# Patient Record
Sex: Female | Born: 1990 | Race: White | Hispanic: No | Marital: Married | Smoking: Never smoker
Health system: Southern US, Community
[De-identification: ages and names within clinical notes are randomized; demographics above are authoritative.]

## PROBLEM LIST (undated history)

## (undated) DIAGNOSIS — R87629 Unspecified abnormal cytological findings in specimens from vagina: Secondary | ICD-10-CM

## (undated) DIAGNOSIS — Z8489 Family history of other specified conditions: Secondary | ICD-10-CM

## (undated) DIAGNOSIS — K219 Gastro-esophageal reflux disease without esophagitis: Secondary | ICD-10-CM

## (undated) DIAGNOSIS — R519 Headache, unspecified: Secondary | ICD-10-CM

## (undated) DIAGNOSIS — E039 Hypothyroidism, unspecified: Secondary | ICD-10-CM

## (undated) DIAGNOSIS — F4310 Post-traumatic stress disorder, unspecified: Secondary | ICD-10-CM

## (undated) DIAGNOSIS — E282 Polycystic ovarian syndrome: Secondary | ICD-10-CM

## (undated) DIAGNOSIS — N390 Urinary tract infection, site not specified: Secondary | ICD-10-CM

## (undated) HISTORY — PX: TONSILLECTOMY: SUR1361

## (undated) HISTORY — PX: EYE SURGERY: SHX253

## (undated) HISTORY — PX: APPENDECTOMY: SHX54

## (undated) HISTORY — DX: Polycystic ovarian syndrome: E28.2

---

## 2019-06-17 LAB — OB RESULTS CONSOLE ABO/RH: RH Type: POSITIVE

## 2019-06-17 LAB — OB RESULTS CONSOLE RUBELLA ANTIBODY, IGM: Rubella: IMMUNE

## 2019-06-17 LAB — OB RESULTS CONSOLE RPR: RPR: NONREACTIVE

## 2019-06-17 LAB — OB RESULTS CONSOLE ANTIBODY SCREEN: Antibody Screen: NEGATIVE

## 2019-06-17 LAB — OB RESULTS CONSOLE GC/CHLAMYDIA
Chlamydia: NEGATIVE
Gonorrhea: NEGATIVE

## 2019-06-17 LAB — OB RESULTS CONSOLE HEPATITIS B SURFACE ANTIGEN: Hepatitis B Surface Ag: NEGATIVE

## 2019-06-17 LAB — OB RESULTS CONSOLE HIV ANTIBODY (ROUTINE TESTING): HIV: NONREACTIVE

## 2019-10-01 ENCOUNTER — Inpatient Hospital Stay (HOSPITAL_COMMUNITY)
Admission: AD | Admit: 2019-10-01 | Discharge: 2019-10-01 | Disposition: A | Discharge status: Home / Self Care | Payer: No Typology Code available for payment source | Attending: Obstetrics and Gynecology | Admitting: Obstetrics and Gynecology

## 2019-10-01 ENCOUNTER — Encounter (HOSPITAL_COMMUNITY): Payer: Self-pay | Admitting: *Deleted

## 2019-10-01 ENCOUNTER — Other Ambulatory Visit: Payer: Self-pay

## 2019-10-01 ENCOUNTER — Inpatient Hospital Stay (HOSPITAL_BASED_OUTPATIENT_CLINIC_OR_DEPARTMENT_OTHER): Payer: No Typology Code available for payment source

## 2019-10-01 DIAGNOSIS — K219 Gastro-esophageal reflux disease without esophagitis: Secondary | ICD-10-CM | POA: Diagnosis not present

## 2019-10-01 DIAGNOSIS — R1030 Lower abdominal pain, unspecified: Secondary | ICD-10-CM | POA: Diagnosis present

## 2019-10-01 DIAGNOSIS — O9A212 Injury, poisoning and certain other consequences of external causes complicating pregnancy, second trimester: Secondary | ICD-10-CM

## 2019-10-01 DIAGNOSIS — Z3A25 25 weeks gestation of pregnancy: Secondary | ICD-10-CM | POA: Diagnosis not present

## 2019-10-01 DIAGNOSIS — Z79899 Other long term (current) drug therapy: Secondary | ICD-10-CM | POA: Insufficient documentation

## 2019-10-01 DIAGNOSIS — W500XXA Accidental hit or strike by another person, initial encounter: Secondary | ICD-10-CM

## 2019-10-01 DIAGNOSIS — E039 Hypothyroidism, unspecified: Secondary | ICD-10-CM | POA: Insufficient documentation

## 2019-10-01 HISTORY — DX: Hypothyroidism, unspecified: E03.9

## 2019-10-01 HISTORY — DX: Gastro-esophageal reflux disease without esophagitis: K21.9

## 2019-10-01 LAB — URINALYSIS, ROUTINE W REFLEX MICROSCOPIC
Bilirubin Urine: NEGATIVE
Glucose, UA: NEGATIVE mg/dL
Hgb urine dipstick: NEGATIVE
Ketones, ur: NEGATIVE mg/dL
Leukocytes,Ua: NEGATIVE
Nitrite: NEGATIVE
Protein, ur: NEGATIVE mg/dL
Specific Gravity, Urine: 1.005 (ref 1.005–1.030)
pH: 6 (ref 5.0–8.0)
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
## 2019-10-01 NOTE — Discharge Instructions (Signed)
Preventing Injuries During Pregnancy ° °Injuries can happen during pregnancy. Minor falls and accidents usually do not harm you or your baby. But some injuries can harm you and your baby. Tell your doctor about any injury you suffer. °What can I do to avoid injuries? °Safety °· Remove rugs and loose objects on the floor. °· Wear comfortable shoes that have a good grip. Do not wear shoes that have high heels. °· Always wear your seat belt in the car. The lap belt should be below your belly. Always drive safely. °· Do not ride on a motorcycle. °Activity °· Do not take part in rough and violent activities or sports. °· Avoid: °? Walking on wet or slippery floors. °? Lifting heavy pots of boiling or hot liquids. °? Fixing electrical problems. °? Being near fires. °General instructions °· Take over-the-counter and prescription medicines only as told by your doctor. °· Know your blood type and the blood type of the baby's father. °· If you are a victim of domestic violence: °? Call your local emergency services (911 in the [place] °? Contact the National Domestic Violence Hotline for help and support. °Get help right away if: °· You fall on your belly or receive any serious blow to your belly. °· You have a stiff neck or neck pain after a fall or an injury. °· You get a headache or have problems with vision after an injury. °· You do not feel the baby move or the baby is not moving as much as normal. °· You have been a victim of domestic violence or any other kind of attack. °· You have been in a car accident. °· You have bleeding from your vagina. °· Fluid is leaking from your vagina. °· You start to have cramping or pain in your belly (contractions). °· You have very bad pain in your lower back. °· You feel weak or pass out (faint). °· You start to throw up (vomit) after an injury. °· You have been burned. °Summary °· Some injuries that happen during pregnancy can do harm to the baby. °· Tell your doctor about any  injury. °· Take steps to avoid injury. This includes removing rugs and loose objects on the floor. Always wear your seat belt in the car. °· Do not take part in rough and violent activities or sports. °· Get help right away if you have any serious accident or injury. °This information is not intended to replace advice given to you by your health care provider. Make sure you discuss any questions you have with your health care provider. °Document Released: 12/15/2010 Document Revised: 11/21/2016 Document Reviewed: 11/21/2016 °Elsevier Patient Education © 2020 Elsevier Inc. ° °

## 2019-11-26 ENCOUNTER — Inpatient Hospital Stay (HOSPITAL_COMMUNITY)
Admission: AD | Admit: 2019-11-26 | Discharge: 2019-11-26 | Disposition: A | Discharge status: Home / Self Care | Payer: No Typology Code available for payment source | Attending: Obstetrics and Gynecology | Admitting: Obstetrics and Gynecology

## 2019-11-26 ENCOUNTER — Encounter (HOSPITAL_COMMUNITY): Payer: Self-pay | Admitting: Obstetrics and Gynecology

## 2019-11-26 DIAGNOSIS — O4703 False labor before 37 completed weeks of gestation, third trimester: Secondary | ICD-10-CM | POA: Insufficient documentation

## 2019-11-26 DIAGNOSIS — R102 Pelvic and perineal pain: Secondary | ICD-10-CM | POA: Insufficient documentation

## 2019-11-26 DIAGNOSIS — O99283 Endocrine, nutritional and metabolic diseases complicating pregnancy, third trimester: Secondary | ICD-10-CM | POA: Insufficient documentation

## 2019-11-26 DIAGNOSIS — O99891 Other specified diseases and conditions complicating pregnancy: Secondary | ICD-10-CM | POA: Insufficient documentation

## 2019-11-26 DIAGNOSIS — Z3A33 33 weeks gestation of pregnancy: Secondary | ICD-10-CM | POA: Diagnosis not present

## 2019-11-26 DIAGNOSIS — O479 False labor, unspecified: Secondary | ICD-10-CM

## 2019-11-26 DIAGNOSIS — O34219 Maternal care for unspecified type scar from previous cesarean delivery: Secondary | ICD-10-CM | POA: Diagnosis not present

## 2019-11-26 DIAGNOSIS — E860 Dehydration: Secondary | ICD-10-CM | POA: Insufficient documentation

## 2019-11-26 HISTORY — DX: Post-traumatic stress disorder, unspecified: F43.10

## 2019-11-26 LAB — URINALYSIS, ROUTINE W REFLEX MICROSCOPIC
Bilirubin Urine: NEGATIVE
Glucose, UA: 150 mg/dL — AB
Hgb urine dipstick: NEGATIVE
Ketones, ur: 20 mg/dL — AB
Leukocytes,Ua: NEGATIVE
Nitrite: NEGATIVE
Protein, ur: NEGATIVE mg/dL
Specific Gravity, Urine: 1.017 (ref 1.005–1.030)
pH: 6 (ref 5.0–8.0)

## 2019-11-26 MED ORDER — NIFEDIPINE 10 MG PO CAPS
10.0000 mg | ORAL_CAPSULE | ORAL | Status: AC
  Administered 2019-11-26 (×3): 10 mg via ORAL
  Filled 2019-11-26 (×3): qty 1

## 2019-11-26 NOTE — MAU Note (Signed)
Been having contractions all day, they were about 41min apart, no are 75min.  Feeling a lot of pelvic pressure and hip pain,going on for 3 days. Denies bleeding or leaking.

## 2019-11-26 NOTE — Discharge Instructions (Signed)
Braxton Hicks Contractions °Contractions of the uterus can occur throughout pregnancy, but they are not always a sign that you are in labor. You may have practice contractions called Braxton Hicks contractions. These false labor contractions are sometimes confused with true labor. °What are Braxton Hicks contractions? °Braxton Hicks contractions are tightening movements that occur in the muscles of the uterus before labor. Unlike true labor contractions, these contractions do not result in opening (dilation) and thinning of the cervix. Toward the end of pregnancy (32-34 weeks), Braxton Hicks contractions can happen more often and may become stronger. These contractions are sometimes difficult to tell apart from true labor because they can be very uncomfortable. You should not feel embarrassed if you go to the hospital with false labor. °Sometimes, the only way to tell if you are in true labor is for your health care provider to look for changes in the cervix. The health care provider will do a physical exam and may monitor your contractions. If you are not in true labor, the exam should show that your cervix is not dilating and your water has not broken. °If there are no other health problems associated with your pregnancy, it is completely safe for you to be sent home with false labor. You may continue to have Braxton Hicks contractions until you go into true labor. °How to tell the difference between true labor and false labor °True labor °· Contractions last 30-70 seconds. °· Contractions become very regular. °· Discomfort is usually felt in the top of the uterus, and it spreads to the lower abdomen and low back. °· Contractions do not go away with walking. °· Contractions usually become more intense and increase in frequency. °· The cervix dilates and gets thinner. °False labor °· Contractions are usually shorter and not as strong as true labor contractions. °· Contractions are usually irregular. °· Contractions  are often felt in the front of the lower abdomen and in the groin. °· Contractions may go away when you walk around or change positions while lying down. °· Contractions get weaker and are shorter-lasting as time goes on. °· The cervix usually does not dilate or become thin. °Follow these instructions at home: ° °· Take over-the-counter and prescription medicines only as told by your health care provider. °· Keep up with your usual exercises and follow other instructions from your health care provider. °· Eat and drink lightly if you think you are going into labor. °· If Braxton Hicks contractions are making you uncomfortable: °? Change your position from lying down or resting to walking, or change from walking to resting. °? Sit and rest in a tub of warm water. °? Drink enough fluid to keep your urine pale yellow. Dehydration may cause these contractions. °? Do slow and deep breathing several times an hour. °· Keep all follow-up prenatal visits as told by your health care provider. This is important. °Contact a health care provider if: °· You have a fever. °· You have continuous pain in your abdomen. °Get help right away if: °· Your contractions become stronger, more regular, and closer together. °· You have fluid leaking or gushing from your vagina. °· You pass blood-tinged mucus (bloody show). °· You have bleeding from your vagina. °· You have low back pain that you never had before. °· You feel your baby’s head pushing down and causing pelvic pressure. °· Your baby is not moving inside you as much as it used to. °Summary °· Contractions that occur before labor are   called Braxton Hicks contractions, false labor, or practice contractions. °· Braxton Hicks contractions are usually shorter, weaker, farther apart, and less regular than true labor contractions. True labor contractions usually become progressively stronger and regular, and they become more frequent. °· Manage discomfort from Braxton Hicks contractions  by changing position, resting in a warm bath, drinking plenty of water, or practicing deep breathing. °This information is not intended to replace advice given to you by your health care provider. Make sure you discuss any questions you have with your health care provider. °Document Revised: 10/25/2017 Document Reviewed: 03/28/2017 °Elsevier Patient Education © 2020 Elsevier Inc. ° °

## 2019-11-26 NOTE — MAU Provider Note (Signed)
History     CSN: [id number]  Arrival date and time: 11/26/19 1747   First Provider Initiated Contact with Patient 11/26/19 1927      Chief Complaint  Patient presents with  . Contractions  . Pelvic Pain   HPI   [name] is a 28 y.o. female G2P1001 @ [redacted]w[redacted]d here in MAU with contractions. States she started having contractions around 0400. Initially they were 20-25 minutes apart, now they are more frequent however less painful. She is concerned because they are not going away. States she has drank around a 1/2 case of water today and does not feel the contractions are d/t dehydration. + fetal movement. No bleeding. No history of preterm labor.   OB History    Gravida  2   Para  1   Term  1   Preterm      AB      Living  1     SAB      TAB      Ectopic      Multiple      Live Births  1           Past Medical History:  Diagnosis Date  . GERD (gastroesophageal reflux disease)   . Hypothyroidism   . Post traumatic stress disorder (PTSD)     Past Surgical History:  Procedure Laterality Date  . APPENDECTOMY    . CESAREAN SECTION    . TONSILLECTOMY      No family history on file.  Social History   Tobacco Use  . Smoking status: Never Smoker  . Smokeless tobacco: Never Used  Substance Use Topics  . Alcohol use: Never  . Drug use: Never    Allergies: No Known Allergies  Medications Prior to Admission  Medication Sig Dispense Refill Last Dose  . cholecalciferol (VITAMIN D3) 25 MCG (1000 UT) tablet Take 1,000 Units by mouth daily.   11/26/2019 at Unknown time  . levothyroxine (SYNTHROID) 50 MCG tablet Take 50 mcg by mouth daily before breakfast.   11/26/2019 at Unknown time  . pantoprazole (PROTONIX) 40 MG tablet Take 40 mg by mouth daily.   11/26/2019 at Unknown time  . Prenatal Vit-Fe Fumarate-FA (PRENATAL MULTIVITAMIN) TABS tablet Take 1 tablet by mouth daily at 12 noon.   11/26/2019 at Unknown time  . sertraline (ZOLOFT) 50 MG  tablet Take 50 mg by mouth daily.   11/26/2019 at Unknown time  . vitamin B-12 (CYANOCOBALAMIN) 500 MCG tablet Take 500 mcg by mouth daily.   More than a month at Unknown time   Results for orders placed or performed during the hospital encounter of 11/26/19 (from the past 48 hour(s))  Urinalysis, Routine w reflex microscopic     Status: Abnormal   Collection Time: 11/26/19  7:06 PM  Result Value Ref Range   Color, Urine YELLOW YELLOW   APPearance CLEAR CLEAR   Specific Gravity, Urine 1.017 1.005 - 1.030   pH 6.0 5.0 - 8.0   Glucose, UA 150 (A) NEGATIVE mg/dL   Hgb urine dipstick NEGATIVE NEGATIVE   Bilirubin Urine NEGATIVE NEGATIVE   Ketones, ur 20 (A) NEGATIVE mg/dL   Protein, ur NEGATIVE NEGATIVE mg/dL   Nitrite NEGATIVE NEGATIVE   Leukocytes,Ua NEGATIVE NEGATIVE    Comment: Performed at [facility] [address], [place], [place] [address]   Review of Systems  Genitourinary: Positive for pelvic pain. Negative for decreased urine volume, vaginal bleeding, vaginal discharge and vaginal pain.  Musculoskeletal: Negative for back pain.   Physical Exam   Blood pressure 119/73, pulse (!) 102, temperature 98.2 F (36.8 C), temperature source Oral, resp. rate 18, weight 102.5 kg, last menstrual period 04/05/2019, SpO2 99 %.  Physical Exam  Constitutional: She is oriented to person, place, and time. She appears well-developed and well-nourished. No distress.  Respiratory: Effort normal.  GI: Soft. She exhibits no distension and no mass. There is no abdominal tenderness. There is no rebound and no guarding.  Genitourinary: There is no rash, tenderness or lesion on the right labia. There is no rash, tenderness or lesion on the left labia.    No vaginal bleeding.  No bleeding in the vagina.    Genitourinary Comments: Dilation: Closed Effacement (%): Thick Cervical Position: Posterior FFN collected Exam by:: Venia Carbon NP   Musculoskeletal:        General: Normal range of  motion.  Neurological: She is alert and oriented to person, place, and time.  Skin: Skin is warm. She is not diaphoretic.  Psychiatric: She has a normal mood and affect. Her behavior is normal.   Fetal Tracing: Baseline: 140 bpm Variability: Moderate  Accelerations: 15x15 Decelerations: None Toco: Occasional   MAU Course  Procedures None  MDM  FFN not sent d/t closed cervix.  UA with ketones, encouraged PO hydration while in MAU>  2 doses of procardia given.  Patient feeling much better. Ok to go home. Still having occasional contractions, will given 1 additional dose of procardia prior to DC home. Abdomen is soft, non- tender.   Assessment and Plan   A:  1. Braxton Hicks contractions   2. [redacted] weeks gestation of pregnancy   3. Mild dehydration     P:  Discharge home in stable condition Return to MAU if symptoms worsen F/u next week Dr. Mindi Slicker  Preterm labor precautions Increase oral fluid intake  Kache Mcclurg, Harolyn Rutherford, NP 11/26/2019 9:11 PM

## 2019-12-17 LAB — OB RESULTS CONSOLE GBS: GBS: NEGATIVE

## 2019-12-30 ENCOUNTER — Encounter (HOSPITAL_COMMUNITY): Payer: Self-pay | Admitting: *Deleted

## 2019-12-30 ENCOUNTER — Telehealth (HOSPITAL_COMMUNITY): Payer: Self-pay | Admitting: *Deleted

## 2019-12-30 NOTE — Telephone Encounter (Signed)
Preadmission screen  

## 2020-01-06 ENCOUNTER — Other Ambulatory Visit (HOSPITAL_COMMUNITY): Payer: No Typology Code available for payment source

## 2020-01-06 ENCOUNTER — Other Ambulatory Visit (HOSPITAL_COMMUNITY)
Admission: RE | Admit: 2020-01-06 | Discharge: 2020-01-06 | Disposition: A | Discharge status: Home / Self Care | Payer: No Typology Code available for payment source | Source: Ambulatory Visit | Attending: Obstetrics and Gynecology | Admitting: Obstetrics and Gynecology

## 2020-01-06 DIAGNOSIS — Z20822 Contact with and (suspected) exposure to covid-19: Secondary | ICD-10-CM | POA: Insufficient documentation

## 2020-01-06 DIAGNOSIS — Z01812 Encounter for preprocedural laboratory examination: Secondary | ICD-10-CM | POA: Insufficient documentation

## 2020-01-06 LAB — SARS CORONAVIRUS 2 (TAT 6-24 HRS): SARS Coronavirus 2: NEGATIVE

## 2020-01-07 ENCOUNTER — Other Ambulatory Visit: Payer: Self-pay | Admitting: Obstetrics and Gynecology

## 2020-01-08 ENCOUNTER — Encounter (HOSPITAL_COMMUNITY): Payer: Self-pay | Admitting: Obstetrics and Gynecology

## 2020-01-08 ENCOUNTER — Inpatient Hospital Stay (HOSPITAL_COMMUNITY): Payer: No Typology Code available for payment source | Admitting: Anesthesiology

## 2020-01-08 ENCOUNTER — Other Ambulatory Visit: Payer: Self-pay

## 2020-01-08 ENCOUNTER — Inpatient Hospital Stay (HOSPITAL_COMMUNITY): Payer: No Typology Code available for payment source

## 2020-01-08 ENCOUNTER — Inpatient Hospital Stay (HOSPITAL_COMMUNITY)
Admission: AD | Admit: 2020-01-08 | Discharge: 2020-01-11 | DRG: 788 | Disposition: A | Discharge status: Home / Self Care | Payer: No Typology Code available for payment source | Attending: Obstetrics and Gynecology | Admitting: Obstetrics and Gynecology

## 2020-01-08 DIAGNOSIS — K219 Gastro-esophageal reflux disease without esophagitis: Secondary | ICD-10-CM | POA: Diagnosis present

## 2020-01-08 DIAGNOSIS — E669 Obesity, unspecified: Secondary | ICD-10-CM | POA: Diagnosis present

## 2020-01-08 DIAGNOSIS — Z98891 History of uterine scar from previous surgery: Secondary | ICD-10-CM

## 2020-01-08 DIAGNOSIS — O9962 Diseases of the digestive system complicating childbirth: Secondary | ICD-10-CM | POA: Diagnosis present

## 2020-01-08 DIAGNOSIS — E039 Hypothyroidism, unspecified: Secondary | ICD-10-CM | POA: Diagnosis present

## 2020-01-08 DIAGNOSIS — O34211 Maternal care for low transverse scar from previous cesarean delivery: Secondary | ICD-10-CM | POA: Diagnosis present

## 2020-01-08 DIAGNOSIS — O99284 Endocrine, nutritional and metabolic diseases complicating childbirth: Secondary | ICD-10-CM | POA: Diagnosis present

## 2020-01-08 DIAGNOSIS — O99214 Obesity complicating childbirth: Secondary | ICD-10-CM | POA: Diagnosis present

## 2020-01-08 DIAGNOSIS — F4310 Post-traumatic stress disorder, unspecified: Secondary | ICD-10-CM | POA: Diagnosis present

## 2020-01-08 DIAGNOSIS — Z362 Encounter for other antenatal screening follow-up: Secondary | ICD-10-CM | POA: Diagnosis not present

## 2020-01-08 DIAGNOSIS — Z3A39 39 weeks gestation of pregnancy: Secondary | ICD-10-CM | POA: Diagnosis not present

## 2020-01-08 DIAGNOSIS — Z20822 Contact with and (suspected) exposure to covid-19: Secondary | ICD-10-CM | POA: Diagnosis present

## 2020-01-08 DIAGNOSIS — O368390 Maternal care for abnormalities of the fetal heart rate or rhythm, unspecified trimester, not applicable or unspecified: Secondary | ICD-10-CM | POA: Diagnosis not present

## 2020-01-08 DIAGNOSIS — O99344 Other mental disorders complicating childbirth: Secondary | ICD-10-CM | POA: Diagnosis present

## 2020-01-08 LAB — CBC
HCT: 37.3 % (ref 36.0–46.0)
Hemoglobin: 11.9 g/dL — ABNORMAL LOW (ref 12.0–15.0)
MCH: 28.1 pg (ref 26.0–34.0)
MCHC: 31.9 g/dL (ref 30.0–36.0)
MCV: 88.2 fL (ref 80.0–100.0)
Platelets: 156 10*3/uL (ref 150–400)
RBC: 4.23 MIL/uL (ref 3.87–5.11)
RDW: 13.2 % (ref 11.5–15.5)
WBC: 12.6 10*3/uL — ABNORMAL HIGH (ref 4.0–10.5)
nRBC: 0 % (ref 0.0–0.2)

## 2020-01-08 LAB — TYPE AND SCREEN
ABO/RH(D): O POS
Antibody Screen: NEGATIVE

## 2020-01-08 LAB — RPR: RPR Ser Ql: NONREACTIVE

## 2020-01-08 LAB — ABO/RH: ABO/RH(D): O POS

## 2020-01-08 MED ORDER — PHENYLEPHRINE 40 MCG/ML (10ML) SYRINGE FOR IV PUSH (FOR BLOOD PRESSURE SUPPORT): 80.0000 ug | PREFILLED_SYRINGE | INTRAVENOUS | Status: DC | PRN

## 2020-01-08 MED ORDER — FENTANYL-BUPIVACAINE-NACL 0.5-0.125-0.9 MG/250ML-% EP SOLN
12.0000 mL/h | EPIDURAL | Status: DC | PRN
  Filled 2020-01-08: qty 250

## 2020-01-08 MED ORDER — OXYCODONE-ACETAMINOPHEN 5-325 MG PO TABS: 2.0000 | ORAL_TABLET | ORAL | Status: DC | PRN

## 2020-01-08 MED ORDER — OXYCODONE-ACETAMINOPHEN 5-325 MG PO TABS: 1.0000 | ORAL_TABLET | ORAL | Status: DC | PRN

## 2020-01-08 MED ORDER — HYDROCORTISONE 1 % EX CREA
TOPICAL_CREAM | Freq: Three times a day (TID) | CUTANEOUS | Status: DC
  Filled 2020-01-08: qty 28

## 2020-01-08 MED ORDER — ONDANSETRON HCL 4 MG/2ML IJ SOLN
4.0000 mg | Freq: Four times a day (QID) | INTRAMUSCULAR | Status: DC | PRN
  Administered 2020-01-08: 4 mg via INTRAVENOUS
  Filled 2020-01-08: qty 2

## 2020-01-08 MED ORDER — DIPHENHYDRAMINE HCL 50 MG/ML IJ SOLN: 12.5000 mg | INTRAMUSCULAR | Status: DC | PRN

## 2020-01-08 MED ORDER — TERBUTALINE SULFATE 1 MG/ML IJ SOLN: 0.2500 mg | Freq: Once | INTRAMUSCULAR | Status: DC | PRN

## 2020-01-08 MED ORDER — BUTORPHANOL TARTRATE 1 MG/ML IJ SOLN
1.0000 mg | INTRAMUSCULAR | Status: DC | PRN
  Administered 2020-01-08 (×2): 1 mg via INTRAVENOUS
  Filled 2020-01-08 (×2): qty 1

## 2020-01-08 MED ORDER — LIDOCAINE HCL (PF) 1 % IJ SOLN: 30.0000 mL | INTRAMUSCULAR | Status: DC | PRN

## 2020-01-08 MED ORDER — OXYTOCIN 40 UNITS IN NORMAL SALINE INFUSION - SIMPLE MED: 2.5000 [IU]/h | INTRAVENOUS | Status: DC

## 2020-01-08 MED ORDER — ACETAMINOPHEN 325 MG PO TABS: 650.0000 mg | ORAL_TABLET | ORAL | Status: DC | PRN

## 2020-01-08 MED ORDER — LIDOCAINE HCL (PF) 1 % IJ SOLN: INTRAMUSCULAR | Status: DC | PRN

## 2020-01-08 MED ORDER — EPHEDRINE 5 MG/ML INJ: 10.0000 mg | INTRAVENOUS | Status: DC | PRN

## 2020-01-08 MED ORDER — LACTATED RINGERS IV SOLN: INTRAVENOUS | Status: DC

## 2020-01-08 MED ORDER — LACTATED RINGERS IV SOLN
500.0000 mL | INTRAVENOUS | Status: DC | PRN
  Administered 2020-01-09: 500 mL via INTRAVENOUS

## 2020-01-08 MED ORDER — OXYTOCIN BOLUS FROM INFUSION: 500.0000 mL | Freq: Once | INTRAVENOUS | Status: DC

## 2020-01-08 MED ORDER — SOD CITRATE-CITRIC ACID 500-334 MG/5ML PO SOLN
30.0000 mL | ORAL | Status: DC | PRN
  Administered 2020-01-09: 30 mL via ORAL
  Filled 2020-01-08: qty 30

## 2020-01-08 MED ORDER — OXYTOCIN 40 UNITS IN NORMAL SALINE INFUSION - SIMPLE MED
1.0000 m[IU]/min | INTRAVENOUS | Status: DC
  Administered 2020-01-08: 2 m[IU]/min via INTRAVENOUS
  Filled 2020-01-08: qty 1000

## 2020-01-08 MED ORDER — FENTANYL 2.5 MCG/ML BUPIVACAINE 1/10 % EPIDURAL INFUSION (WH - ANES): INTRAMUSCULAR | Status: DC | PRN

## 2020-01-08 MED ORDER — LACTATED RINGERS IV SOLN
500.0000 mL | Freq: Once | INTRAVENOUS | Status: AC
  Administered 2020-01-08: 500 mL via INTRAVENOUS

## 2020-01-08 NOTE — Progress Notes (Signed)
Feeling ctx Afeb, VSS FHT- 140-150, Cat I, ctx q 3-4 min VE-3-4/60/-2, vtx, AROM clear Continue pitocin, monitor progress

## 2020-01-08 NOTE — Anesthesia Procedure Notes (Deleted)

## 2020-01-08 NOTE — Progress Notes (Signed)
After VE and IUPC, FHT with some ? early decels, then a 6 minute decel which responded to position change.  FHT currently 140s, mod variability with pitocin off.  Will let FHT recover for about 15 minutes, then restart pitocin and see how baby tolerates

## 2020-01-08 NOTE — H&P (Signed)
Kim Park is a 29 y.o. femaleG2 P1001, EGA 39+ weeks with EDC 2-15 presenting for attempted VBAC induction.  Prenatal care essentially uncomplicated, on Zoloft for PTSD, previous LTCS, desires VBAC, has signed consent.  Had intracervical foley placed around 1100 yesterday for cervical ripening, reactive NST after, catheter is still in place this morning.  OB History    Gravida  2   Para  1   Term  1   Preterm      AB      Living  1     SAB      TAB      Ectopic      Multiple      Live Births  1          Past Medical History:  Diagnosis Date  . GERD (gastroesophageal reflux disease)   . Hypothyroidism   . PCOS (polycystic ovarian syndrome)   . Post traumatic stress disorder (PTSD)    Past Surgical History:  Procedure Laterality Date  . APPENDECTOMY    . CESAREAN SECTION    . EYE SURGERY    . TONSILLECTOMY     Family History: family history includes Breast cancer in her mother; Diabetes in her father; Hypertension in her father; Ovarian cancer in her mother; Stroke in her father; Thyroid cancer in her mother. Social History:  reports that she has never smoked. She has never used smokeless tobacco. She reports that she does not drink alcohol or use drugs.     Maternal Diabetes: No Genetic Screening: Normal Maternal Ultrasounds/Referrals: Normal Fetal Ultrasounds or other Referrals:  None Maternal Substance Abuse:  No Significant Maternal Medications:  None Significant Maternal Lab Results:  Group B Strep negative Other Comments:  None  Review of Systems  Respiratory: Negative.   Cardiovascular: Negative.    Maternal Medical History:  Fetal activity: Perceived fetal activity is normal.    Prenatal complications: no prenatal complications Prenatal Complications - Diabetes: none.      Blood pressure (!) 130/97, pulse (!) 108, last menstrual period 04/05/2019. Maternal Exam:  Uterine Assessment: Contraction strength is mild.  Contraction  frequency is rare.   Abdomen: Patient reports no abdominal tenderness. Surgical scars: low transverse.   Estimated fetal weight is 8 lbs.   Fetal presentation: vertex  Introitus: Normal vulva. Normal vagina.  Amniotic fluid character: not assessed.  Pelvis: adequate for delivery.      Physical Exam  Constitutional: She appears well-developed and well-nourished.  Cardiovascular: Normal rate and regular rhythm.  Respiratory: Effort normal. No respiratory distress.  GI: Soft.  Genitourinary:    Vulva normal.     Prenatal labs: ABO, Rh: O/Positive/-- (07/22 0000) Antibody: Negative (07/22 0000) Rubella: Immune (07/22 0000) RPR: Nonreactive (07/22 0000)  HBsAg: Negative (07/22 0000)  HIV: Non-reactive (07/22 0000)  GBS: Negative/-- (01/21 0000)   Assessment/Plan: IUP at 39+ weeks, previous LTCS for attempted VBAC.  Has intracervical foley in place, will start pitocin, monitor progress   Zenaida Niece 01/08/2020, 9:02 AM

## 2020-01-08 NOTE — Anesthesia Preprocedure Evaluation (Deleted)
Anesthesia Evaluation  Patient identified by MRN, date of birth, ID band Patient awake    Reviewed: Allergy & Precautions, NPO status , Patient's Chart, lab work & pertinent test results  Airway Mallampati: II  TM Distance: >3 FB Neck ROM: Full    Dental no notable dental hx. (+) Teeth Intact   Pulmonary neg pulmonary ROS,    Pulmonary exam normal breath sounds clear to auscultation       Cardiovascular Exercise Tolerance: Good negative cardio ROS Normal cardiovascular exam Rhythm:Regular Rate:Normal     Neuro/Psych Hx of PTSDnegative neurological ROS     GI/Hepatic Neg liver ROS,   Endo/Other  Hypothyroidism   Renal/GU negative Renal ROS     Musculoskeletal   Abdominal (+) + obese,   Peds  Hematology Hgb 11.0  Plt 156   Anesthesia Other Findings   Reproductive/Obstetrics (+) Pregnancy                             Anesthesia Physical Anesthesia Plan  ASA: III  Anesthesia Plan: Epidural   Post-op Pain Management:    Induction:   PONV Risk Score and Plan:   Airway Management Planned:   Additional Equipment:   Intra-op Plan:   Post-operative Plan:   Informed Consent: I have reviewed the patients History and Physical, chart, labs and discussed the procedure including the risks, benefits and alternatives for the proposed anesthesia with the patient or authorized representative who has indicated his/her understanding and acceptance.       Plan Discussed with:   Anesthesia Plan Comments: (39 5/7 Wk G2 P1 For TOLAC  w Hx of PCOS and hypo Thyroid)        Anesthesia Quick Evaluation

## 2020-01-08 NOTE — Progress Notes (Signed)
Comfortable with epidural Afeb, VSS FHT- 130s, mod variability, + accels, some variable and early decels, Cat II, ctx q 2-3 min VE-5/80/-1, vtx, IUPC placed Will continue pitocin, monitor progress, anticipate VBAC

## 2020-01-08 NOTE — Progress Notes (Signed)
Feeling some ctx, foley bulb came out at 1100 Afeb, VSS FHT-130-140, Cat I VE3/30/-3, vtx Will continue pitocin, monitor progress, AROM at lower station

## 2020-01-08 NOTE — Progress Notes (Signed)
Per request by Dr. Jackelyn Knife and Maralyn Sago RN, at bedside to confirm presentation. Vertex confirmed via ultrasound.  Clayton Bibles, MSN, CNM Certified Nurse Midwife, [employer] for [employer], [facility] 01/08/20 10:05 AM

## 2020-01-09 ENCOUNTER — Encounter (HOSPITAL_COMMUNITY)
Admission: AD | Disposition: A | Discharge status: Home / Self Care | Payer: Self-pay | Source: Home / Self Care | Attending: Obstetrics and Gynecology

## 2020-01-09 ENCOUNTER — Encounter (HOSPITAL_COMMUNITY): Payer: Self-pay | Admitting: Obstetrics and Gynecology

## 2020-01-09 DIAGNOSIS — O368390 Maternal care for abnormalities of the fetal heart rate or rhythm, unspecified trimester, not applicable or unspecified: Secondary | ICD-10-CM | POA: Diagnosis not present

## 2020-01-09 SURGERY — Surgical Case
Anesthesia: Regional

## 2020-01-09 MED ORDER — NALBUPHINE HCL 10 MG/ML IJ SOLN: 5.0000 mg | INTRAMUSCULAR | Status: DC | PRN

## 2020-01-09 MED ORDER — SENNOSIDES-DOCUSATE SODIUM 8.6-50 MG PO TABS
2.0000 | ORAL_TABLET | ORAL | Status: DC
  Administered 2020-01-09 – 2020-01-10 (×2): 2 via ORAL
  Filled 2020-01-09 (×2): qty 2

## 2020-01-09 MED ORDER — KETOROLAC TROMETHAMINE 30 MG/ML IJ SOLN
INTRAMUSCULAR | Status: AC
  Filled 2020-01-09: qty 1

## 2020-01-09 MED ORDER — MEASLES, MUMPS & RUBELLA VAC IJ SOLR: 0.5000 mL | Freq: Once | INTRAMUSCULAR | Status: DC

## 2020-01-09 MED ORDER — SERTRALINE HCL 50 MG PO TABS
50.0000 mg | ORAL_TABLET | Freq: Two times a day (BID) | ORAL | Status: DC
  Administered 2020-01-09 – 2020-01-11 (×5): 50 mg via ORAL
  Filled 2020-01-09 (×5): qty 1

## 2020-01-09 MED ORDER — LEVOTHYROXINE SODIUM 50 MCG PO TABS
50.0000 ug | ORAL_TABLET | Freq: Every day | ORAL | Status: DC
  Administered 2020-01-09 – 2020-01-11 (×3): 50 ug via ORAL
  Filled 2020-01-09 (×3): qty 1

## 2020-01-09 MED ORDER — LIDOCAINE-EPINEPHRINE (PF) 2 %-1:200000 IJ SOLN
INTRAMUSCULAR | Status: DC | PRN
  Administered 2020-01-09: 1 mL via EPIDURAL
  Administered 2020-01-09: 5 mL via EPIDURAL

## 2020-01-09 MED ORDER — ONDANSETRON HCL 4 MG/2ML IJ SOLN
INTRAMUSCULAR | Status: DC | PRN
  Administered 2020-01-09: 4 mg via INTRAVENOUS

## 2020-01-09 MED ORDER — FENTANYL CITRATE (PF) 100 MCG/2ML IJ SOLN
INTRAMUSCULAR | Status: AC
  Filled 2020-01-09: qty 2

## 2020-01-09 MED ORDER — SODIUM CHLORIDE 0.9 % IR SOLN
Status: DC | PRN
  Administered 2020-01-09: 1

## 2020-01-09 MED ORDER — DIPHENHYDRAMINE HCL 25 MG PO CAPS: 25.0000 mg | ORAL_CAPSULE | ORAL | Status: DC | PRN

## 2020-01-09 MED ORDER — SCOPOLAMINE 1 MG/3DAYS TD PT72: 1.0000 | MEDICATED_PATCH | Freq: Once | TRANSDERMAL | Status: DC

## 2020-01-09 MED ORDER — NALOXONE HCL 0.4 MG/ML IJ SOLN: 0.4000 mg | INTRAMUSCULAR | Status: DC | PRN

## 2020-01-09 MED ORDER — OXYTOCIN 40 UNITS IN NORMAL SALINE INFUSION - SIMPLE MED: 2.5000 [IU]/h | INTRAVENOUS | Status: AC

## 2020-01-09 MED ORDER — DIPHENHYDRAMINE HCL 25 MG PO CAPS
25.0000 mg | ORAL_CAPSULE | Freq: Four times a day (QID) | ORAL | Status: DC | PRN
  Administered 2020-01-09: 25 mg via ORAL
  Filled 2020-01-09: qty 1

## 2020-01-09 MED ORDER — OXYCODONE HCL 5 MG PO TABS
5.0000 mg | ORAL_TABLET | ORAL | Status: DC | PRN
  Administered 2020-01-10: 10 mg via ORAL
  Administered 2020-01-10: 5 mg via ORAL
  Administered 2020-01-11 (×2): 10 mg via ORAL
  Filled 2020-01-09: qty 1
  Filled 2020-01-09 (×3): qty 2

## 2020-01-09 MED ORDER — LACTATED RINGERS IV SOLN: INTRAVENOUS | Status: DC

## 2020-01-09 MED ORDER — PHENYLEPHRINE HCL (PRESSORS) 10 MG/ML IV SOLN
INTRAVENOUS | Status: DC | PRN
  Administered 2020-01-09: 80 ug via INTRAVENOUS
  Administered 2020-01-09: 40 ug via INTRAVENOUS

## 2020-01-09 MED ORDER — MENTHOL 3 MG MT LOZG: 1.0000 | LOZENGE | OROMUCOSAL | Status: DC | PRN

## 2020-01-09 MED ORDER — NALBUPHINE HCL 10 MG/ML IJ SOLN: 5.0000 mg | Freq: Once | INTRAMUSCULAR | Status: DC | PRN

## 2020-01-09 MED ORDER — FENTANYL CITRATE (PF) 100 MCG/2ML IJ SOLN
INTRAMUSCULAR | Status: DC | PRN
  Administered 2020-01-09: 100 ug via EPIDURAL

## 2020-01-09 MED ORDER — ENOXAPARIN SODIUM 60 MG/0.6ML ~~LOC~~ SOLN
0.5000 mg/kg | SUBCUTANEOUS | Status: DC
  Administered 2020-01-10 – 2020-01-11 (×2): 55 mg via SUBCUTANEOUS
  Filled 2020-01-09 (×2): qty 0.6

## 2020-01-09 MED ORDER — WITCH HAZEL-GLYCERIN EX PADS: 1.0000 "application " | MEDICATED_PAD | CUTANEOUS | Status: DC | PRN

## 2020-01-09 MED ORDER — DIBUCAINE (PERIANAL) 1 % EX OINT: 1.0000 "application " | TOPICAL_OINTMENT | CUTANEOUS | Status: DC | PRN

## 2020-01-09 MED ORDER — KETOROLAC TROMETHAMINE 30 MG/ML IJ SOLN
30.0000 mg | Freq: Once | INTRAMUSCULAR | Status: AC | PRN
  Administered 2020-01-09: 30 mg via INTRAVENOUS

## 2020-01-09 MED ORDER — SODIUM CHLORIDE (PF) 0.9 % IJ SOLN
INTRAMUSCULAR | Status: DC | PRN
  Administered 2020-01-08: 12 mL/h via EPIDURAL

## 2020-01-09 MED ORDER — ZOLPIDEM TARTRATE 5 MG PO TABS: 5.0000 mg | ORAL_TABLET | Freq: Every evening | ORAL | Status: DC | PRN

## 2020-01-09 MED ORDER — MORPHINE SULFATE (PF) 0.5 MG/ML IJ SOLN
INTRAMUSCULAR | Status: AC
  Filled 2020-01-09: qty 10

## 2020-01-09 MED ORDER — OXYCODONE HCL 5 MG PO TABS: 5.0000 mg | ORAL_TABLET | Freq: Once | ORAL | Status: DC | PRN

## 2020-01-09 MED ORDER — LIDOCAINE HCL (PF) 1 % IJ SOLN
INTRAMUSCULAR | Status: DC | PRN
  Administered 2020-01-08: 5 mL via EPIDURAL

## 2020-01-09 MED ORDER — SODIUM CHLORIDE 0.9 % IV SOLN: INTRAVENOUS | Status: DC | PRN

## 2020-01-09 MED ORDER — MORPHINE SULFATE (PF) 0.5 MG/ML IJ SOLN
INTRAMUSCULAR | Status: DC | PRN
  Administered 2020-01-09: 3 mg via EPIDURAL

## 2020-01-09 MED ORDER — TETANUS-DIPHTH-ACELL PERTUSSIS 5-2.5-18.5 LF-MCG/0.5 IM SUSP: 0.5000 mL | Freq: Once | INTRAMUSCULAR | Status: DC

## 2020-01-09 MED ORDER — METHYLERGONOVINE MALEATE 0.2 MG PO TABS: 0.2000 mg | ORAL_TABLET | ORAL | Status: DC | PRN

## 2020-01-09 MED ORDER — NALOXONE HCL 4 MG/10ML IJ SOLN
1.0000 ug/kg/h | INTRAVENOUS | Status: DC | PRN
  Filled 2020-01-09: qty 5

## 2020-01-09 MED ORDER — DEXAMETHASONE SODIUM PHOSPHATE 4 MG/ML IJ SOLN
INTRAMUSCULAR | Status: AC
  Filled 2020-01-09: qty 1

## 2020-01-09 MED ORDER — LACTATED RINGERS IV SOLN: INTRAVENOUS | Status: DC | PRN

## 2020-01-09 MED ORDER — OXYCODONE HCL 5 MG/5ML PO SOLN: 5.0000 mg | Freq: Once | ORAL | Status: DC | PRN

## 2020-01-09 MED ORDER — ONDANSETRON HCL 4 MG/2ML IJ SOLN
4.0000 mg | Freq: Three times a day (TID) | INTRAMUSCULAR | Status: DC | PRN
  Administered 2020-01-09: 14:00:00 4 mg via INTRAVENOUS
  Filled 2020-01-09: qty 2

## 2020-01-09 MED ORDER — CEFAZOLIN SODIUM-DEXTROSE 2-3 GM-%(50ML) IV SOLR
INTRAVENOUS | Status: DC | PRN
  Administered 2020-01-09: 2 g via INTRAVENOUS

## 2020-01-09 MED ORDER — ONDANSETRON HCL 4 MG/2ML IJ SOLN: 4.0000 mg | Freq: Once | INTRAMUSCULAR | Status: DC | PRN

## 2020-01-09 MED ORDER — SODIUM CHLORIDE 0.9% FLUSH: 3.0000 mL | INTRAVENOUS | Status: DC | PRN

## 2020-01-09 MED ORDER — ACETAMINOPHEN 500 MG PO TABS
1000.0000 mg | ORAL_TABLET | Freq: Four times a day (QID) | ORAL | Status: DC
  Administered 2020-01-09 – 2020-01-11 (×9): 1000 mg via ORAL
  Filled 2020-01-09 (×10): qty 2

## 2020-01-09 MED ORDER — OXYTOCIN 40 UNITS IN NORMAL SALINE INFUSION - SIMPLE MED
INTRAVENOUS | Status: AC
  Filled 2020-01-09: qty 1000

## 2020-01-09 MED ORDER — OXYTOCIN 40 UNITS IN NORMAL SALINE INFUSION - SIMPLE MED
INTRAVENOUS | Status: DC | PRN
  Administered 2020-01-09: 40 [IU] via INTRAVENOUS

## 2020-01-09 MED ORDER — DIPHENHYDRAMINE HCL 50 MG/ML IJ SOLN: 12.5000 mg | INTRAMUSCULAR | Status: DC | PRN

## 2020-01-09 MED ORDER — HYDROMORPHONE HCL 1 MG/ML IJ SOLN: 0.2500 mg | INTRAMUSCULAR | Status: DC | PRN

## 2020-01-09 MED ORDER — ONDANSETRON HCL 4 MG/2ML IJ SOLN
INTRAMUSCULAR | Status: AC
  Filled 2020-01-09: qty 4

## 2020-01-09 MED ORDER — COCONUT OIL OIL: 1.0000 "application " | TOPICAL_OIL | Status: DC | PRN

## 2020-01-09 MED ORDER — SIMETHICONE 80 MG PO CHEW
80.0000 mg | CHEWABLE_TABLET | ORAL | Status: DC | PRN
  Administered 2020-01-09 – 2020-01-10 (×2): 80 mg via ORAL
  Filled 2020-01-09 (×2): qty 1

## 2020-01-09 MED ORDER — KETOROLAC TROMETHAMINE 30 MG/ML IJ SOLN
30.0000 mg | Freq: Four times a day (QID) | INTRAMUSCULAR | Status: AC
  Administered 2020-01-09: 30 mg via INTRAVENOUS
  Filled 2020-01-09 (×2): qty 1

## 2020-01-09 MED ORDER — PHENYLEPHRINE 40 MCG/ML (10ML) SYRINGE FOR IV PUSH (FOR BLOOD PRESSURE SUPPORT)
PREFILLED_SYRINGE | INTRAVENOUS | Status: AC
  Filled 2020-01-09: qty 10

## 2020-01-09 MED ORDER — MAGNESIUM HYDROXIDE 400 MG/5ML PO SUSP: 30.0000 mL | ORAL | Status: DC | PRN

## 2020-01-09 MED ORDER — CEFAZOLIN SODIUM-DEXTROSE 2-4 GM/100ML-% IV SOLN
INTRAVENOUS | Status: AC
  Filled 2020-01-09: qty 100

## 2020-01-09 MED ORDER — SODIUM BICARBONATE 8.4 % IV SOLN
INTRAVENOUS | Status: AC
  Filled 2020-01-09: qty 50

## 2020-01-09 MED ORDER — IBUPROFEN 800 MG PO TABS
800.0000 mg | ORAL_TABLET | Freq: Three times a day (TID) | ORAL | Status: DC
  Administered 2020-01-10 – 2020-01-11 (×5): 800 mg via ORAL
  Filled 2020-01-09 (×6): qty 1

## 2020-01-09 MED ORDER — DEXAMETHASONE SODIUM PHOSPHATE 4 MG/ML IJ SOLN
INTRAMUSCULAR | Status: DC | PRN
  Administered 2020-01-09: 4 mg via INTRAVENOUS

## 2020-01-09 MED ORDER — LIDOCAINE-EPINEPHRINE (PF) 2 %-1:200000 IJ SOLN
INTRAMUSCULAR | Status: AC
  Filled 2020-01-09: qty 10

## 2020-01-09 MED ORDER — METHYLERGONOVINE MALEATE 0.2 MG/ML IJ SOLN: 0.2000 mg | INTRAMUSCULAR | Status: DC | PRN

## 2020-01-09 MED ORDER — PRENATAL MULTIVITAMIN CH
1.0000 | ORAL_TABLET | Freq: Every day | ORAL | Status: DC
  Administered 2020-01-09 – 2020-01-11 (×3): 1 via ORAL
  Filled 2020-01-09 (×3): qty 1

## 2020-01-09 SURGICAL SUPPLY — 39 items
BENZOIN TINCTURE PRP APPL 2/3 (GAUZE/BANDAGES/DRESSINGS) ×3 IMPLANT
CHLORAPREP W/TINT 26ML (MISCELLANEOUS) ×3 IMPLANT
CLAMP CORD UMBIL (MISCELLANEOUS) IMPLANT
CLOSURE STERI-STRIP 1/2X4 (GAUZE/BANDAGES/DRESSINGS) ×1
CLOTH BEACON ORANGE TIMEOUT ST (SAFETY) ×3 IMPLANT
CLSR STERI-STRIP ANTIMIC 1/2X4 (GAUZE/BANDAGES/DRESSINGS) ×2 IMPLANT
DRSG OPSITE POSTOP 4X10 (GAUZE/BANDAGES/DRESSINGS) ×3 IMPLANT
ELECT REM PT RETURN 9FT ADLT (ELECTROSURGICAL) ×3
ELECTRODE REM PT RTRN 9FT ADLT (ELECTROSURGICAL) ×1 IMPLANT
EXTRACTOR VACUUM KIWI (MISCELLANEOUS) IMPLANT
EXTRACTOR VACUUM M CUP 4 TUBE (SUCTIONS) IMPLANT
EXTRACTOR VACUUM M CUP 4' TUBE (SUCTIONS)
GLOVE BIOGEL PI IND STRL 7.0 (GLOVE) ×1 IMPLANT
GLOVE BIOGEL PI INDICATOR 7.0 (GLOVE) ×2
GLOVE ORTHO TXT STRL SZ7.5 (GLOVE) ×3 IMPLANT
GOWN STRL REUS W/TWL LRG LVL3 (GOWN DISPOSABLE) ×6 IMPLANT
KIT ABG SYR 3ML LUER SLIP (SYRINGE) IMPLANT
NEEDLE HYPO 25X5/8 SAFETYGLIDE (NEEDLE) ×3 IMPLANT
NS IRRIG 1000ML POUR BTL (IV SOLUTION) ×3 IMPLANT
PACK C SECTION WH (CUSTOM PROCEDURE TRAY) ×3 IMPLANT
PAD OB MATERNITY 4.3X12.25 (PERSONAL CARE ITEMS) ×3 IMPLANT
PENCIL SMOKE EVAC W/HOLSTER (ELECTROSURGICAL) ×3 IMPLANT
RETRACTOR TRAXI PANNICULUS (MISCELLANEOUS) ×1 IMPLANT
RTRCTR C-SECT PINK 25CM LRG (MISCELLANEOUS) ×3 IMPLANT
SUT CHROMIC 1 CTX 36 (SUTURE) ×6 IMPLANT
SUT PLAIN 0 NONE (SUTURE) IMPLANT
SUT PLAIN 2 0 (SUTURE) ×2
SUT PLAIN 2 0 XLH (SUTURE) IMPLANT
SUT PLAIN ABS 2-0 CT1 27XMFL (SUTURE) ×1 IMPLANT
SUT VIC AB 0 CT1 27 (SUTURE) ×4
SUT VIC AB 0 CT1 27XBRD ANBCTR (SUTURE) ×2 IMPLANT
SUT VIC AB 2-0 CT1 (SUTURE) ×3 IMPLANT
SUT VIC AB 2-0 CT1 27 (SUTURE) ×2
SUT VIC AB 2-0 CT1 TAPERPNT 27 (SUTURE) ×1 IMPLANT
SUT VIC AB 4-0 KS 27 (SUTURE) IMPLANT
TOWEL OR 17X24 6PK STRL BLUE (TOWEL DISPOSABLE) ×3 IMPLANT
TRAXI PANNICULUS RETRACTOR (MISCELLANEOUS) ×2
TRAY FOLEY W/BAG SLVR 14FR LF (SET/KITS/TRAYS/PACK) ×3 IMPLANT
WATER STERILE IRR 1000ML POUR (IV SOLUTION) ×3 IMPLANT

## 2020-01-09 NOTE — Transfer of Care (Signed)
Immediate Anesthesia Transfer of Care Note  Patient: Kim Park  Procedure(s) Performed: CESAREAN SECTION (N/A )  Patient Location: PACU  Anesthesia Type:Epidural  Level of Consciousness: awake, alert  and oriented  Airway & Oxygen Therapy: Patient Spontanous Breathing  Post-op Assessment: Report given to RN and Post -op Vital signs reviewed and stable  Post vital signs: Reviewed and stable  Last Vitals:  Vitals Value Taken Time  BP 101/56 01/09/20 0305  Temp    Pulse 82 01/09/20 0308  Resp 15 01/09/20 0308  SpO2 95 % 01/09/20 0308  Vitals shown include unvalidated device data.  Last Pain:  Vitals:   01/09/20 0100  TempSrc:   PainSc: 0-No pain         Complications: No apparent anesthesia complications

## 2020-01-09 NOTE — Progress Notes (Signed)
POD #0 Doing ok, pain ok, just had emesis Afeb, VSS Abd- soft, dressing C/D/I, fundus firm Continue routine care, will do circumcision on boy tomorrow

## 2020-01-09 NOTE — Anesthesia Preprocedure Evaluation (Signed)
Anesthesia Evaluation  Patient identified by MRN, date of birth, ID band Patient awake    Airway Mallampati: II  TM Distance: >3 FB Neck ROM: Full    Dental no notable dental hx. (+) Teeth Intact   Pulmonary neg pulmonary ROS,    Pulmonary exam normal breath sounds clear to auscultation       Cardiovascular negative cardio ROS Normal cardiovascular exam Rhythm:Regular Rate:Normal     Neuro/Psych negative neurological ROS  negative psych ROS   GI/Hepatic Neg liver ROS,   Endo/Other  negative endocrine ROSHypothyroidism   Renal/GU negative Renal ROS     Musculoskeletal   Abdominal   Peds  Hematology   Anesthesia Other Findings   Reproductive/Obstetrics (+) Pregnancy                             Anesthesia Physical Anesthesia Plan  ASA: III  Anesthesia Plan: Epidural   Post-op Pain Management:    Induction:   PONV Risk Score and Plan: Treatment may vary due to age or medical condition  Airway Management Planned: Nasal Cannula and Natural Airway  Additional Equipment:   Intra-op Plan:   Post-operative Plan:   Informed Consent: I have reviewed the patients History and Physical, chart, labs and discussed the procedure including the risks, benefits and alternatives for the proposed anesthesia with the patient or authorized representative who has indicated his/her understanding and acceptance.       Plan Discussed with: CRNA  Anesthesia Plan Comments:         Anesthesia Quick Evaluation

## 2020-01-09 NOTE — Anesthesia Preprocedure Evaluation (Signed)
Anesthesia Evaluation  Patient identified by MRN, date of birth, ID band  Airway Mallampati: II  TM Distance: >3 FB Neck ROM: Full    Dental no notable dental hx. (+) Teeth Intact   Pulmonary neg pulmonary ROS,    Pulmonary exam normal breath sounds clear to auscultation       Cardiovascular Normal cardiovascular exam Rhythm:Regular Rate:Normal     Neuro/Psych negative neurological ROS  negative psych ROS   GI/Hepatic Neg liver ROS, GERD  ,  Endo/Other  Hypothyroidism   Renal/GU negative Renal ROS     Musculoskeletal negative musculoskeletal ROS (+)   Abdominal   Peds  Hematology negative hematology ROS (+)   Anesthesia Other Findings   Reproductive/Obstetrics (+) Pregnancy                             Anesthesia Physical Anesthesia Plan  ASA: III  Anesthesia Plan: Epidural   Post-op Pain Management:    Induction:   PONV Risk Score and Plan:   Airway Management Planned:   Additional Equipment:   Intra-op Plan:   Post-operative Plan:   Informed Consent: I have reviewed the patients History and Physical, chart, labs and discussed the procedure including the risks, benefits and alternatives for the proposed anesthesia with the patient or authorized representative who has indicated his/her understanding and acceptance.       Plan Discussed with:   Anesthesia Plan Comments:         Anesthesia Quick Evaluation

## 2020-01-09 NOTE — Progress Notes (Signed)
FHT had improved to 140s, Cat I, pitocin restarted, now with late and prolonged decels again, VE unchanged.  Discussed with patient, recommended c-section, she agrees.  Procedure and risks discussed.  Pitocin is off, will proceed when OR is ready

## 2020-01-09 NOTE — Anesthesia Postprocedure Evaluation (Signed)
Anesthesia Post Note  Patient: AMIYAH SHRYOCK  Procedure(s) Performed: CESAREAN SECTION (N/A )     Patient location during evaluation: Mother Baby Anesthesia Type: Epidural Level of consciousness: awake and alert Pain management: pain level controlled Vital Signs Assessment: post-procedure vital signs reviewed and stable Respiratory status: spontaneous breathing, nonlabored ventilation and respiratory function stable Cardiovascular status: stable Postop Assessment: no headache, no backache and epidural receding Anesthetic complications: no Comments: Per telephone conversation    Last Vitals:  Vitals:   01/09/20 0700 01/09/20 0820  BP: 104/60 106/66  Pulse: 75 74  Resp: 18 18  Temp: 36.8 C 36.8 C  SpO2: 97% 97%    Last Pain:  Vitals:   01/09/20 0820  TempSrc: Oral  PainSc:    Pain Goal:                Epidural/Spinal Function Cutaneous sensation: Able to Wiggle Toes (01/09/20 0819), Patient able to flex knees: Yes (01/09/20 0819), Patient able to lift hips off bed: Yes (01/09/20 0819), Back pain beyond tenderness at insertion site: No (01/09/20 0819), Progressively worsening motor and/or sensory loss: No (01/09/20 0819), Bowel and/or bladder incontinence post epidural: No (01/09/20 0819)  Emmaline Kluver N

## 2020-01-09 NOTE — Lactation Note (Signed)
This note was copied from a baby's chart. Lactation Consultation Note  Patient Name: Kim Park [id number] Date: 01/09/2020 Reason for consult: Initial assessment  Baby Kim now 22 hours old. Mom with HX of PCOS, depression.(PTSD) Mom is an experienced breastfeeding mom.  Mom attempted to breastfed with her first baby.  But it was very painful and infant was not gaining but mom reports she knew she had good milk so she pumped and bottle fed her first breastmilk for 18 months.  Mom reports not problems with her supply.  Donated breastmilk to two friends also was able to pump 20 oz at a time.   Mom reports she has a good breast and a bad breast.  Bad breast is the right one and she struggles to latch him on that side. Mom attempting to breastfeed on right breast on arrival.  Infant at breast just smacking on nipple.  Asked if I could assist/mom agreed.Mom is very large breasted.  Showed mom how to roll up baby blanket and place under breast for support and latch infant on right.  Mom reports comfort with breastfeeding.  Nipple round and elongated when infant let go and came off.  Mom nipple a little reddened and small blisters from previous feed attempts.Mom fed him 3 ml of pumped milk via syringe.  Then offered the other breast.  Mom able to latch infant on left breast easily.  Good bf observed.  Praised Breastfeeding.  Reviewed and left handouts.  Maternal Data Formula Feeding for Exclusion: No Has patient been taught Hand Expression?: Yes Does the patient have breastfeeding experience prior to this delivery?: Yes  Feeding Feeding Type: Breast Fed  LATCH Score Latch: Grasps breast easily, tongue down, lips flanged, rhythmical sucking.  Audible Swallowing: Spontaneous and intermittent  Type of Nipple: Everted at rest and after stimulation  Comfort (Breast/Nipple): Filling, red/small blisters or bruises, mild/mod discomfort  Hold (Positioning): Assistance needed to correctly position  infant at breast and maintain latch.  LATCH Score: 8  Interventions Interventions: Assisted with latch;Breast massage;Hand express;Support pillows;Position options  Lactation Tools Discussed/Used     Consult Status Consult Status: Follow-up Date: 01/10/20 Follow-up type: In-patient    [facility] Michaelle Copas 01/09/2020, 12:45 PM

## 2020-01-09 NOTE — Progress Notes (Signed)
Dr. Jackelyn Knife at bedside consenting patient for cesarean section.

## 2020-01-09 NOTE — Op Note (Signed)
Preoperative diagnosis: Intrauterine pregnancy at 39 weeks, previous c-section, fetal heart rate decelerations Postoperative diagnosis: Same Procedure: Repeat low transverse cesarean section without extensions Surgeon: Lavina Hamman M.D. Anesthesia: Epidural  Findings: Patient had normal gravid anatomy with omental adhesions to left uterus and anterior abdominal wall, delivered a viable female infant with Apgars of 9 and 9 weight pending Estimated blood loss: 200 cc Specimens: Placenta sent to labor and delivery Complications: None  Procedure in detail: The patient was taken to the operating room and placed in the dorsosupine position with left tilt. Her previously placed epidural was dosed appropriately.  Abdomen was then prepped and draped in the usual sterile fashion, a foley catheter had previously been inserted. The level of her anesthesia was found to be adequate. Abdomen was entered via a standard Pfannenstiel incision through her previous scar. Once the peritoneal cavity was entered omental adhesions were taken down and the Alexis disposable self-retaining retractor was placed and good visualization was achieved. Bladder flap was released due to adhesions.  A 4 cm transverse incision was then made in the lower uterine segment pushing the bladder inferior. Once the uterine cavity was entered the incision was extended digitally, the lower uterine segment was fairly thin. The fetal vertex was grasped and delivered through the incision atraumatically. Mouth and nares were suctioned. The remainder of the infant then delivered atraumatically. Cord was doubly clamped and cut and the infant handed to the awaiting pediatric team. Cord blood was obtained. The placenta delivered spontaneously. Uterus was wiped dry with clean lap pad and all clots and debris were removed. Uterine incision was inspected and found to be free of extensions. Uterine incision was closed in 1 layer with running locking #1 Chromic.  Tubes and ovaries were inspected and found to be normal. Omental adhesions to the uterus were taken down with Bovie.  Uterine incision was inspected and found to be hemostatic. Bleeding from serosal edges was controlled with electrocautery. The Alexis retractor was removed. Subfascial space was irrigated and made hemostatic with electrocautery.  Further omental adhesions were taken down with Bovie and this completely released the omentum.  Peritoneum was closed with 2-0 Vicryl.  Fascia was closed in running fashion starting at both ends and meeting in the middle with 0 Vicryl. Subcutaneous tissue was then irrigated and made hemostatic with electrocautery, then closed with running 2-0 plain gut. Skin was closed with running 4-0 Vicryl subcuticular suture followed by steri-strips and a sterile dressing. Patient tolerated the procedure well and was taken to the recovery in stable condition. Counts were correct x2, she received Ancef 2 g IV at the beginning of the procedure and she had PAS hose on throughout the procedure.

## 2020-01-10 LAB — CBC
HCT: 31.7 % — ABNORMAL LOW (ref 36.0–46.0)
Hemoglobin: 10 g/dL — ABNORMAL LOW (ref 12.0–15.0)
MCH: 28.2 pg (ref 26.0–34.0)
MCHC: 31.5 g/dL (ref 30.0–36.0)
MCV: 89.3 fL (ref 80.0–100.0)
Platelets: 134 10*3/uL — ABNORMAL LOW (ref 150–400)
RBC: 3.55 MIL/uL — ABNORMAL LOW (ref 3.87–5.11)
RDW: 13.6 % (ref 11.5–15.5)
WBC: 12.9 10*3/uL — ABNORMAL HIGH (ref 4.0–10.5)
nRBC: 0 % (ref 0.0–0.2)

## 2020-01-10 NOTE — Progress Notes (Signed)
CSW acknowledges consult.  CSW attempted to meet with MOB, however MOB reported that she was trying to get some rest.  CSW will attempt to visit with MOB at a later time.   Rodolfo Gaster, LCSW Clinical Social Worker [facility] Cell#: [phone]   

## 2020-01-10 NOTE — Lactation Note (Signed)
This note was copied from a baby's chart. Lactation Consultation Note  Patient Name: Kim Park Date: 01/10/2020 Reason for consult: Follow-up assessment;Term;1st time breastfeeding  P2 mother whose infant is now 37 hours old.  This is a term baby.  Mother attempted breast feeding with her first child (now 75 1/27 years old) but he would never latch.  She pumped and bottle fed for 18 months.  Mother stated she had enough milk to feed three babies with her first pregnancy (fed her child and donated breast milk for two of her friends).  Mother requested lactation assistance.  Baby had a circumcision this a.m.  When I arrived mother had been trying to get him latched.  Offered to assist and mother agreeable.  Mother's breasts are large, soft and non tender.  Nipples are short shafted and intact.  Mother has more difficulty latching baby to the right breast.  Positioned mother appropriately and blanket roll placed under her breast.  Showed mother how to lift and support her breast prior to latching and reminded her to keep her fingers away from her areolas when latching.  Mother stated baby does not open wide so assisted with showing mother how to obtain a wide gape prior to latching.  Assisted to latch in the football hold.  Immediately, mother wanted to make an "air hole" for baby to breathe.  Asked her to remove her hand and showed her how baby could breathe easily with compressing the breast near his nose.  He began sucking and mother reported no pain.  She stated, "This is much better."  Taught breast compressions and gentle stimulation to help keep him awake.  Mother pleased to see him feeding. Discussed basic breast feeding concepts while observing him feed for 10 minutes.  He was still feeding when I left the room.  Mother has a manual pump and a DEBP for home use.  Father present.  Encouraged mother to call her RN/LC for assistance as needed.   Maternal Data Formula Feeding for  Exclusion: No Has patient been taught Hand Expression?: Yes Does the patient have breastfeeding experience prior to this delivery?: No  Feeding Feeding Type: Breast Fed  LATCH Score Latch: Grasps breast easily, tongue down, lips flanged, rhythmical sucking.  Audible Swallowing: None  Type of Nipple: Everted at rest and after stimulation(short shafted)  Comfort (Breast/Nipple): Soft / non-tender  Hold (Positioning): Assistance needed to correctly position infant at breast and maintain latch.  LATCH Score: 7  Interventions Interventions: Breast feeding basics reviewed;Assisted with latch;Skin to skin;Breast massage;Hand express;Breast compression;Adjust position;Hand pump;Position options;Support pillows  Lactation Tools Discussed/Used     Consult Status Consult Status: Follow-up Date: 01/11/20 Follow-up type: In-patient    Dora Sims 01/10/2020, 3:46 PM

## 2020-01-10 NOTE — Lactation Note (Signed)
This note was copied from a baby's chart. Lactation Consultation Note  Patient Name: Kim Park Date: 01/10/2020 Reason for consult: Follow-up assessment;Mother's request;Difficult latch;Term;Infant weight loss P2, 44 hour female term female infant.  Mom's hx: hypothyroidism, C/S delivery,  PPD, PTSD, and Anxiety two medications are safe with breastfeeding Synthroid -L2 and Zoloft -L2 Mom requested LC services due to difficulties with infant latching, cluster feeding, flat nipples that is sore with abrasions.  LC noticed that infant was circumcised earlier today.  Parents requested donor milk to supplement with breastfeeding, current MB is out of donor breast milk and order been placed for family. LC entered room infant was very irritable, LC asked mom permission to touch breast and help with hand expression, infant was given 5 mls to help him calm down due to him cluster feeding. LC explained to parents infant is cluster feeding and what it means and why, how it help with establishing mom's milk supply and volume. Per mom, she has sore breast, she showed LC the abrasions and LC ensure mom she hears her concerns and that she will assist her in latching infant at breast and work towards a solution to help latching infant be better for her. LC gave mom comfort gels and ask mom to use EBM on her breast due to mom having an allergic reaction to coconut oil. Mom did breast stimulation  prior to latching infant on left breast using the football hold position, LC asked mom to bring infant to her , wait until infant mouth is wide, tongue down, with nose and chin touching breast, pictures used in the Mother and infant booklet to demonstrate with teaching latch. Mom doesn't need a NS breast responds wells to stimulation and pre-pumping with hand pump.  LC asked mom how did latch feel, per mom she said pinch, LC had mom re-latch infant with more depth, infant closer to breast, after a few attempts  of latching per mom, she felt a tug and not pain. LC explain she should feel tug, when infant came off breast to be burp mom's nipples were rounded and parents both saw a difference in how mom's nipples looked. Mom re-latched infant on right breast using the football hold, infant was still breastfeeding after 20 minutes when LC left the room. Mom will continue to work on latching infant at breast.  Mom understands the importance of breaking the latch and re-latching infant if she is feeling breast pain and not a tug with infant latch. Mom knows to call RN or LC if she needs assist with latching infant at breast. Mom will continue to breastfeed infant according to hunger cues, on demand, 8 to 12 times within 24 hours.   Mom will continue to breastfeed infant, hand express afterwards and continue to do STS.     Maternal Data    Feeding Feeding Type: Breast Fed  LATCH Score Latch: Grasps breast easily, tongue down, lips flanged, rhythmical sucking.  Audible Swallowing: Spontaneous and intermittent  Type of Nipple: Flat  Comfort (Breast/Nipple): Filling, red/small blisters or bruises, mild/mod discomfort  Hold (Positioning): Assistance needed to correctly position infant at breast and maintain latch.  LATCH Score: 7  Interventions Interventions: Assisted with latch;Adjust position;Support pillows;Skin to skin;Breast massage;Position options;Hand express;Expressed milk;Pre-pump if needed;Shells;Hand pump;Comfort gels;Breast compression  Lactation Tools Discussed/Used     Consult Status Consult Status: Follow-up Date: 01/11/20 Follow-up type: In-patient    Kim Park 01/10/2020, 10:59 PM

## 2020-01-10 NOTE — Progress Notes (Signed)
CSW received consult for hx of Anxiety and Depression.  CSW met with MOB to offer support and complete assessment.    CSW met with MOB at bedside to discuss consult for history of anxiety and depression, FOB present. CSW introduced self and asked to speak with MOB privately, FOB was holding infant and MOB reported that she can leave the room. CSW met with MOB in a private area and MOB asked if this was about her PTSD. CSW replied yes and explained reason for consult. MOB was welcoming, open and engaged during assessment. CSW and MOB discussed MOB's mental health history. MOB reported that she was diagnosed with PTSD, anxiety and depression in either 2014 or 2015. 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 MOB reported that she is currently taking sertraline and plans to restart hydroxyzine now that she has given birth. MOB reported that she has an appointment with her therapist at the [place] on the 22nd and an appointment to meet with her psychiatrist about adjusting her medications now that she has delivered. MOB reported that therapy and her medications are helpful in treating her symptoms. CSW positively affirmed MOB's mental health treatment and being proactive. MOB shared that during her last pregnancy she completley stopped her medication which was not helpful and this time her mental health is better managed with decreasing her medication versus stopping them altogether. MOB reported that she has experienced some anxiety and paranoia during this pregnancy. MOB reported that her paranoia is not new and attributed some of her anxiety to having a new infant. CSW  inquired about MOB's coping skills, MOB reported that staying home is helpful. MOB reported that she has all items needed to care for infant including a car seat and basinet. CSW inquired about how MOB is feeling emotionally after giving birth, MOB reported that she feels  pretty good. MOB presented calm and was open when discussing her mental health history and treatment. CSW assessed for safety, MOB denied SI, HI and domestic violence. MOB reported that she has a safety plan in place due to her last experience after being pregnant.   CSW provided education regarding the baby blues period vs. perinatal mood disorders, discussed treatment and gave resources for mental health follow up if concerns arise.  CSW recommends self-evaluation during the postpartum time period using the New Mom Checklist from Postpartum Progress and encouraged MOB to contact a medical professional if symptoms are noted at any time.    CSW provided review of Sudden Infant Death Syndrome (SIDS) precautions.    CSW identifies no further need for intervention and no barriers to discharge at this time.  Abundio Miu, [place] Worker [facility] Cell#: [phone]

## 2020-01-10 NOTE — Progress Notes (Signed)
Subjective: Postpartum Day #1: Cesarean Delivery Patient reports incisional pain, tolerating PO and no problems voiding.    Objective: Vital signs in last 24 hours: Temp:  [98 F (36.7 C)-98.6 F (37 C)] 98.6 F (37 C) (02/14 0601) Pulse Rate:  [70-80] 74 (02/14 0601) Resp:  [16-18] 18 (02/14 0601) BP: (97-115)/(64-69) 97/64 (02/14 0601) SpO2:  [95 %-100 %] 100 % (02/14 0601)  Physical Exam:  General: alert Lochia: appropriate Uterine Fundus: firm Incision: healing well   Recent Labs    01/08/20 0843 01/10/20 0504  HGB 11.9* 10.0*  HCT 37.3 31.7*    Assessment/Plan: Status post Cesarean section. Doing well postoperatively.  Continue current care, ambulate.  Leighton Roach Decorey Wahlert 01/10/2020, 9:43 AM

## 2020-01-11 ENCOUNTER — Encounter (HOSPITAL_COMMUNITY): Payer: Self-pay | Admitting: Obstetrics and Gynecology

## 2020-01-11 DIAGNOSIS — Z98891 History of uterine scar from previous surgery: Secondary | ICD-10-CM

## 2020-01-11 MED ORDER — ONDANSETRON 4 MG PO TBDP
4.0000 mg | ORAL_TABLET | Freq: Once | ORAL | Status: AC
  Administered 2020-01-11: 15:00:00 4 mg via ORAL
  Filled 2020-01-11: qty 1

## 2020-01-11 MED ORDER — IBUPROFEN 800 MG PO TABS: 800.0000 mg | ORAL_TABLET | Freq: Three times a day (TID) | ORAL | 1 refills | Status: DC

## 2020-01-11 MED ORDER — OXYCODONE HCL 5 MG PO TABS: 5.0000 mg | ORAL_TABLET | Freq: Four times a day (QID) | ORAL | 0 refills | Status: DC | PRN

## 2020-01-11 NOTE — Progress Notes (Signed)
During MD rounds Dr. Reina Fuse wanted pts Kim Park changed before discharge. Order obtained, and dressing changed with second RN, Rachael Kovalak in sterile fashion. New dressing clean, dry, and intact.

## 2020-01-11 NOTE — Progress Notes (Signed)
POSTPARTUM POSTOP PROGRESS NOTE  POD #2  Subjective:  No acute events overnight.  Pt denies problems with ambulating, voiding or po intake.  She denies nausea or vomiting.  Pain is well controlled.  She has had flatus. She has not had bowel movement.  Lochia Minimal. Slowly getting colostrum in. Of note, 141/90s BP this AM, repeat WNL in room. Patient denies PreE symptoms. Baby boy already circed  Objective: Blood pressure (!) 141/94, pulse 93, temperature 97.9 F (36.6 C), temperature source Oral, resp. rate 16, height 4' 10.75" (1.492 m), weight 108 kg, last menstrual period 04/05/2019, SpO2 100 %, unknown if currently breastfeeding.  Physical Exam:  General: alert, cooperative and no distress Lochia:normal flow Chest: CTAB Heart: RRR no m/r/g Abdomen: +BS, soft, nontender. Obese, dependent edema in midline and pannus, no erythema Uterine Fundus: firm, 2cm below umbilicus. Honeycomb dressing intact, serosanguinous drainage Extremities: neg edema, neg calf TTP BL, neg Homans BL  Recent Labs    01/10/20 0504  HGB 10.0*  HCT 31.7*    Assessment/Plan:  ASSESSMENT: Kim Park is a 29 y.o. [id number] s/p RLTCS @ [redacted]w[redacted]d for FHR decels during attempted TOLAC. PNC c/b h/o csx x1, obesity, PTSD on Zoloft, hypothyroidism.   Discharge home, Breastfeeding and Contraception unsure  Continue Levo plus Zoloft 100mg  both daily.    LOS: 3 days

## 2020-01-11 NOTE — Lactation Note (Signed)
This note was copied from a baby's chart. Lactation Consultation Note  Patient Name: Kim Park [id number] Date: 01/11/2020 Reason for consult: Follow-up assessment;Term;Difficult latch;Nipple pain/trauma;Infant weight loss;Other (Comment)(8 % weight loss . per mom re-weight at 2 pm - D/C pending/ per mom pumping and bottle feeding for now)  Baby is 23 hours old .  As LC entered the room mom preparing her breast to pump by breast massage . Mom has her own DEBP - Lansinoh and is aware of how to use it.  Mom mentioned she is just pumping for now.  LC observed mom hand expressing with results and pumping and the flange ( no number on it ) appeared to be comfortable and per mom. Milk is coming in.  [facility] assessed breast tissue with mom permission and both nipples has positional  Strips intact. LC instructed mom on the use of the reverse pressure and then had her compress areolas . Both nipples compressible before and after pumping.  LC recommended giving her nipples a break until the soreness has improved and either try latching the baby with or with out the Nipple Shield #24 . LC sized mom and the #24 NS was good fit and a curved tip syringe provided to instill EBM into the top for  An appetizer and then latch with firm support.  Sore nipple and engorgement prevention and tx reviewed. LC stressed the importance of prevention and then how to tx engorgement if it occurred.  If the latch continues to be difficult keep pumping protect milk supply.  Per mom aware of the storage guidelines.  Mom mentioned if she has difficulty latching she will pump like her 1st baby.  Mom aware to gradually increase the volume the baby is receiving by 7 days at least 45 ml - 60 ml per feeding. Feed with feeding cues and by 3 hours.  LC recommended when her nipples hard healed to consider calling back for [facility] appt. In about 1 week.  Mom has comfort gels ( aware x 6 days ) , breast shells 10 mins prior to feeding   Or alternating with comfort gels while awake.  LC sized mom for #24 NS and provided a curved tip syringe.  Mom expressed appreciation for [facility] assist and help.  Mom aware of the [facility] virtually and the [facility] phone numbers.    Maternal Data Has patient been taught Hand Expression?: Yes  Feeding Feeding Type: (per mom baby last  fed at 12noon - 25 ml and green stool) Nipple Type: Slow - flow  LATCH Score                   Interventions Interventions: Breast feeding basics reviewed;Shells;Comfort gels  Lactation Tools Discussed/Used Tools: Shells;Pump;Comfort gels;Nipple Shields Nipple shield size: 24(LC sized mom for a #24 NS - see LC note) Shell Type: Inverted Breast pump type: Other (comment)(moms has her own DEBP) WIC Program: No Pump Review: Milk Storage   Consult Status Consult Status: Follow-up(LC recommended when the sore nipples has healed consider calling back for LC appt.) Follow-up type: Out-patient    Kim Park Kim Park 01/11/2020, 1:53 PM

## 2020-01-11 NOTE — Discharge Summary (Signed)
OB Discharge Summary     Patient Name: Kim Park DOB: [birth year] MRN: [id number]  Date of admission: 01/08/2020 Delivering MD: Jackelyn Knife, TODD   Date of discharge: 01/11/2020  Admitting diagnosis: Indication for care in labor or delivery [O75.9] Intrauterine pregnancy: [redacted]w[redacted]d     Secondary diagnosis:  Active Problems:   Indication for care in labor or delivery   Fetal heart rate decelerations affecting management of mother   History of cesarean section  Additional problems: none     Discharge diagnosis: Term Pregnancy Delivered                                                                                                Post partum procedures:none  Augmentation: Pitocin and Foley Balloon  Complications: None  Hospital course:  Induction of Labor With Cesarean Section  29 y.o. yo G2P2002 at [redacted]w[redacted]d was admitted to the hospital 01/08/2020 for induction of labor; patient desired TOLAC after h/o csx x1. Patient had a labor course significant for FHR decels with initiation of pitocin after cervical ripening with Foley Baloon . The patient went for cesarean section due to FHR decels, and delivered a Viable infant,01/09/2020  Membrane Rupture Time/Date: 4:40 PM ,01/08/2020   Details of operation can be found in separate operative Note.  Patient had an uncomplicated postpartum course. She is ambulating, tolerating a regular diet, passing flatus, and urinating well.  Patient is discharged home in stable condition on 01/11/20.                                    Physical exam  Vitals:   01/10/20 0601 01/10/20 1357 01/10/20 2100 01/11/20 0642  BP: 97/64 134/88 125/89 (!) 141/94  Pulse: 74 95 93   Resp: 18 18 18 16   Temp: 98.6 F (37 C) 98.3 F (36.8 C) 98.5 F (36.9 C) 97.9 F (36.6 C)  TempSrc: Oral Oral Oral Oral  SpO2: 100%   100%  Weight:      Height:       General: alert, cooperative and no distress Lochia: appropriate Uterine Fundus: firm Incision: Healing well with  no significant drainage, No significant erythema DVT Evaluation: No evidence of DVT seen on physical exam. Negative Homan's sign. Labs: Lab Results  Component Value Date   WBC 12.9 (H) 01/10/2020   HGB 10.0 (L) 01/10/2020   HCT 31.7 (L) 01/10/2020   MCV 89.3 01/10/2020   PLT 134 (L) 01/10/2020   No flowsheet data found.  Discharge instruction: per After Visit Summary and "Baby and Me Booklet".  After visit meds:  Allergies as of 01/11/2020   No Known Allergies     Medication List    TAKE these medications   cholecalciferol 25 MCG (1000 UNIT) tablet Commonly known as: VITAMIN D3 Take 1,000 Units by mouth daily.   ibuprofen 800 MG tablet Commonly known as: ADVIL Take 1 tablet (800 mg total) by mouth every 8 (eight) hours.   levothyroxine 50 MCG tablet Commonly known as: SYNTHROID Take 50 mcg by  mouth daily before breakfast.   oxyCODONE 5 MG immediate release tablet Commonly known as: Oxy IR/ROXICODONE Take 1 tablet (5 mg total) by mouth every 6 (six) hours as needed for severe pain.   pantoprazole 40 MG tablet Commonly known as: PROTONIX Take 40 mg by mouth daily.   prenatal multivitamin Tabs tablet Take 1 tablet by mouth daily at 12 noon.   sertraline 50 MG tablet Commonly known as: ZOLOFT Take 50 mg by mouth in the morning and at bedtime.   vitamin B-12 500 MCG tablet Commonly known as: CYANOCOBALAMIN Take 500 mcg by mouth daily.       Diet: routine diet  Activity: Advance as tolerated. Pelvic rest for 6 weeks.   Outpatient follow up:2wk incision check, 6 wk routine pp Follow up Appt:No future appointments. Follow up Visit:No follow-ups on file.  Postpartum contraception: Undecided  Newborn Data: Live born female  Birth Weight: 8 lb 1.8 oz (3680 g) APGAR: 64, 9  Newborn Delivery   Birth date/time: 01/09/2020 02:11:00 Delivery type: C-Section, Low Vertical Trial of labor: Yes C-section categorization: Repeat      Baby Feeding:  Breast Disposition:home with mother   01/11/2020 Deliah Boston, MD

## 2020-01-20 NOTE — Anesthesia Postprocedure Evaluation (Signed)
Anesthesia Post Note  Patient: Kim Park  Procedure(s) Performed: AN AD HOC LABOR EPIDURAL     Patient location during evaluation: PACU Anesthesia Type: Epidural Level of consciousness: oriented and awake and alert Pain management: pain level controlled Vital Signs Assessment: post-procedure vital signs reviewed and stable Respiratory status: spontaneous breathing and respiratory function stable Cardiovascular status: blood pressure returned to baseline and stable Postop Assessment: no headache, no backache, no apparent nausea or vomiting and able to ambulate Anesthetic complications: no    Last Vitals: There were no vitals filed for this visit.  Last Pain: There were no vitals filed for this visit.               Trevor Iha

## 2020-04-07 ENCOUNTER — Other Ambulatory Visit: Payer: Self-pay | Admitting: Obstetrics and Gynecology

## 2020-04-07 DIAGNOSIS — R1011 Right upper quadrant pain: Secondary | ICD-10-CM

## 2020-04-13 ENCOUNTER — Other Ambulatory Visit: Payer: No Typology Code available for payment source

## 2020-10-10 IMAGING — US US MFM OB LIMITED
1 series · 14 of 21 positions shown · non-contrast
Comparison: none

[Series 1: us mfm ob limited · 14 of 21 slices shown]
[im 1/21]
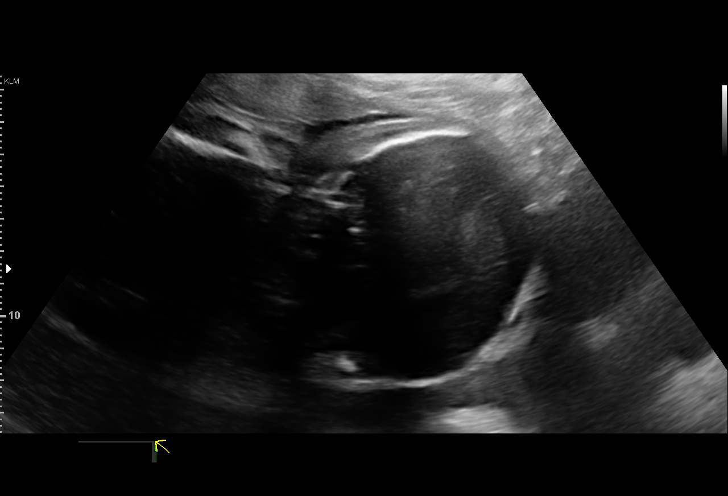
[im 3/21]
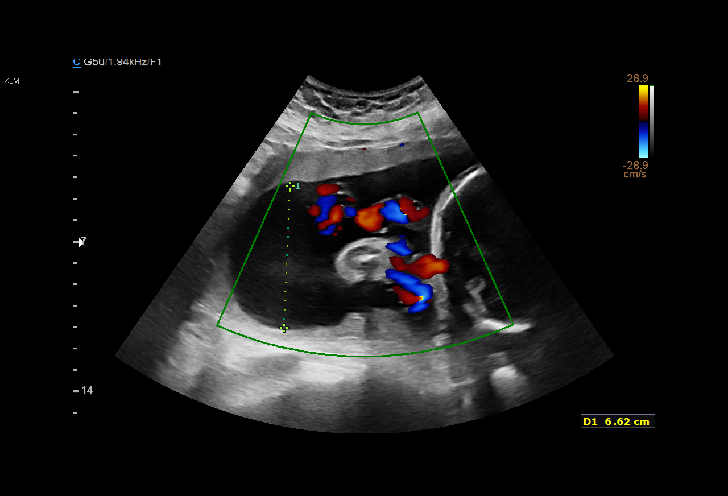
[im 4/21]
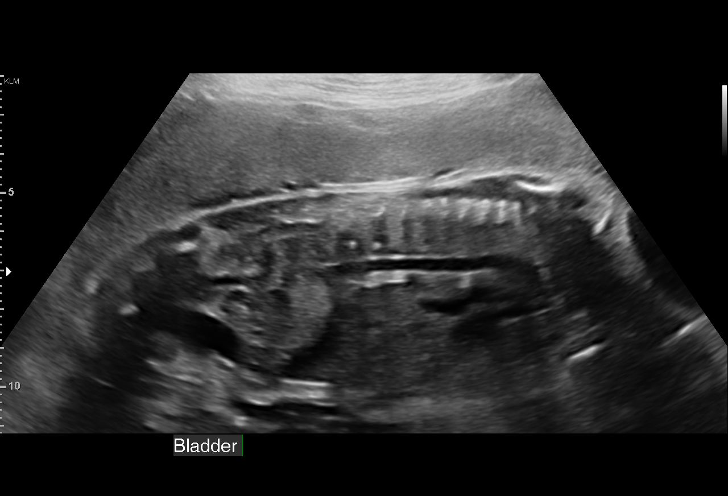
[im 6/21]
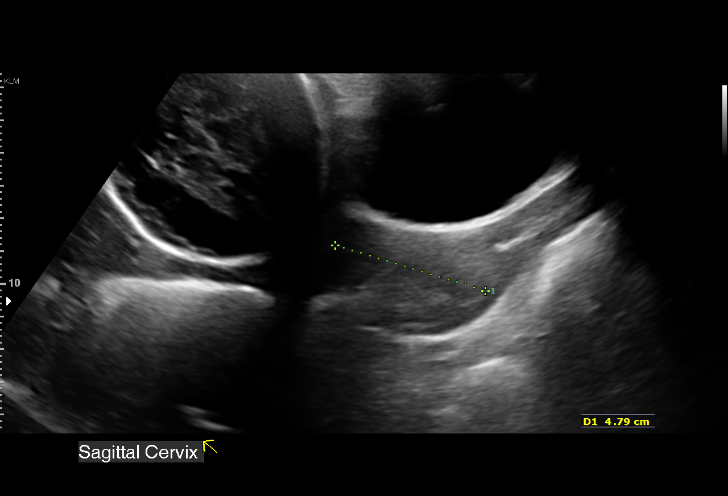
[im 7/21]
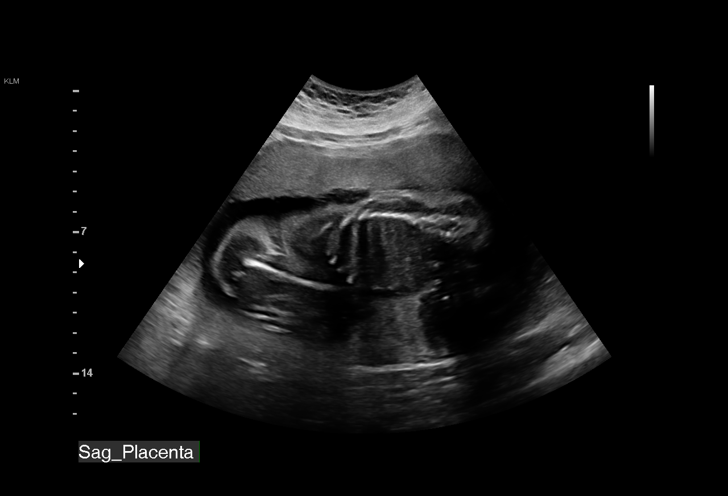
[im 9/21]
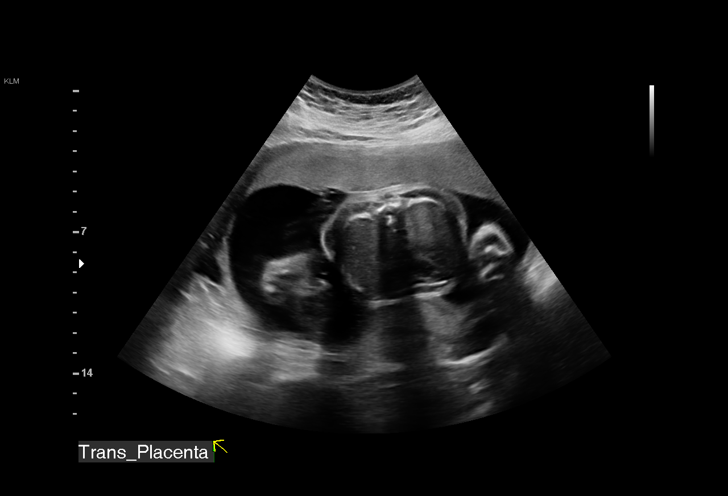
[im 10/21]
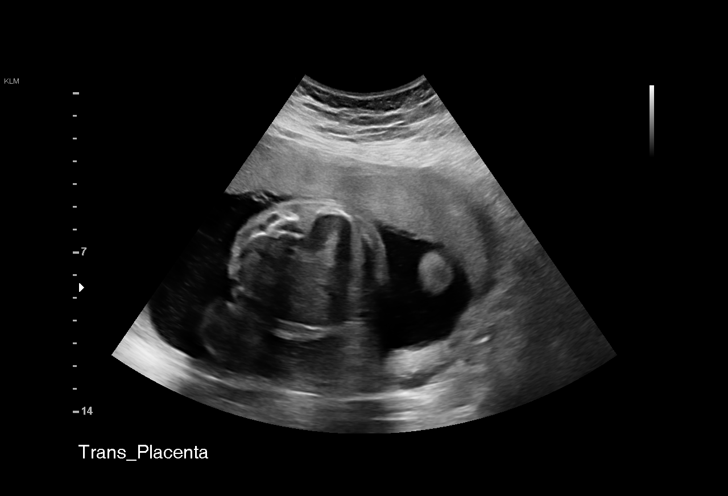
[im 12/21]
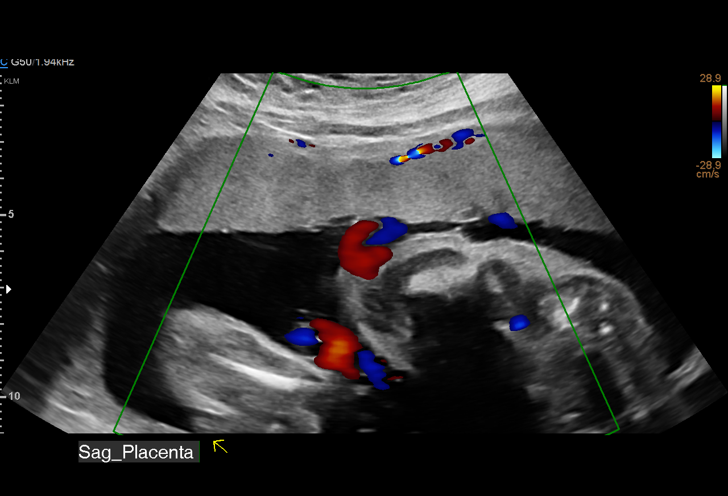
[im 13/21]
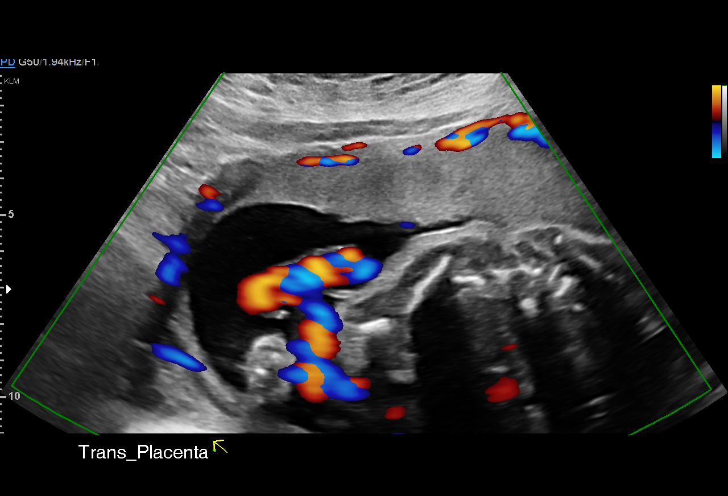
[im 15/21]
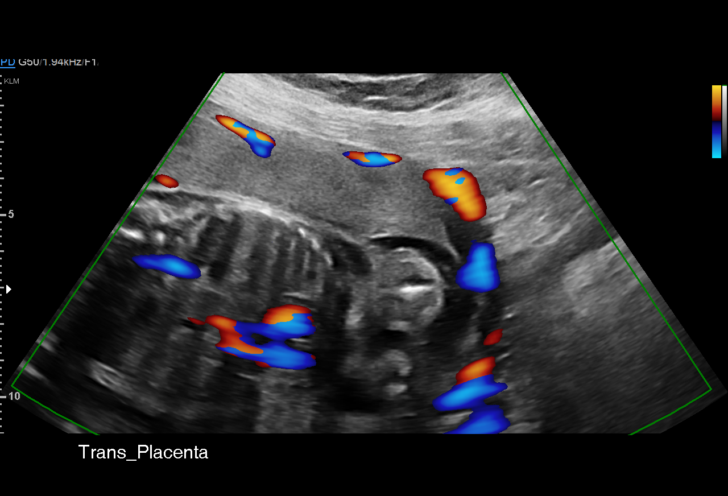
[im 16/21]
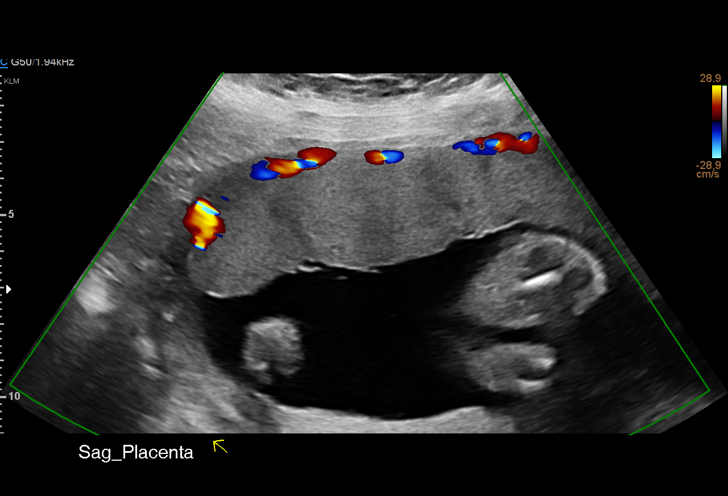
[im 18/21]
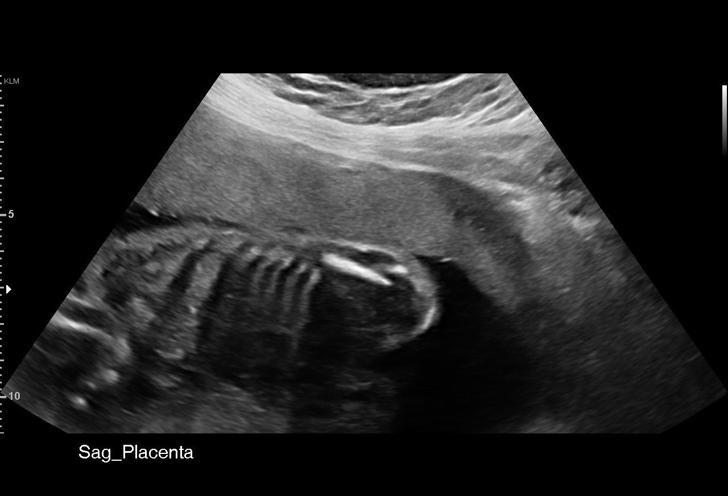
[im 19/21]
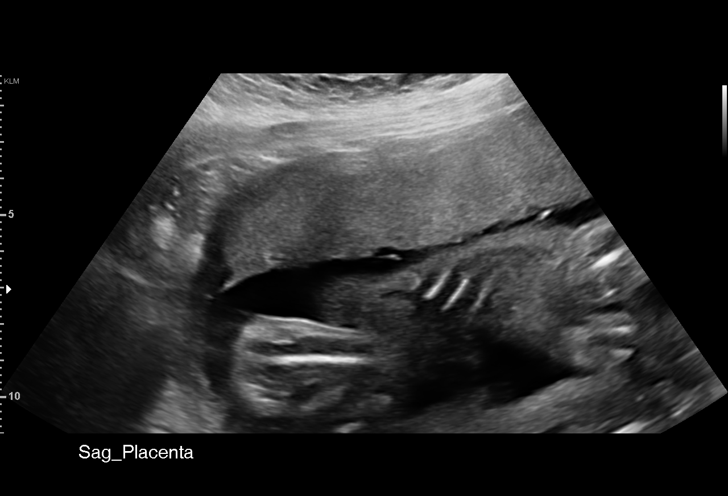
[im 21/21]
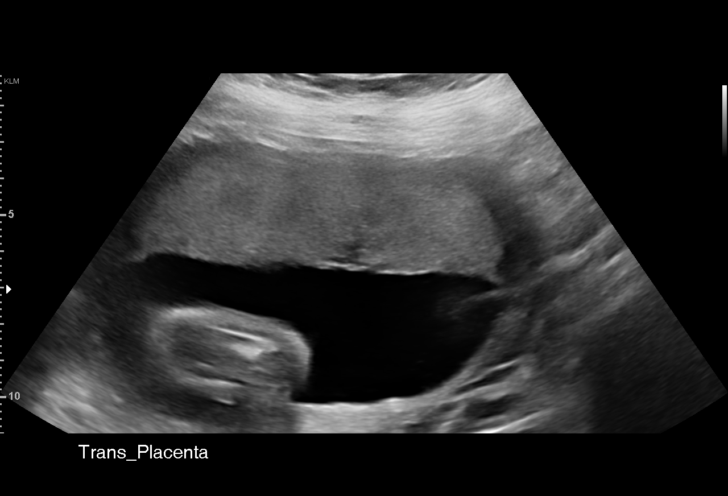

[14 of 21 positions shown; findings below may reference images not displayed]

[facility] [HOSPITAL]
 Attending:        Dk Villeneuve        Secondary Phy.:   [facility]-
                                                            MAU/Triage
 Referred By:      AVANTIKA QUINTANAR          [facility]
                   [facility]                             [HOSPITAL]

  1  US MFM OB LIMITED                    76815.01     CALEB AUJLA
 ----------------------------------------------------------------------

 ----------------------------------------------------------------------
Indications

  Traumatic injury during pregnancy
  25 weeks gestation of pregnancy
 ----------------------------------------------------------------------
Fetal Evaluation

 Num Of Fetuses:         1
 Fetal Heart Rate(bpm):  140
 Cardiac Activity:       Observed
 Presentation:           Cephalic
 Placenta:               Anterior
 P. Cord Insertion:      Visualized

 Amniotic Fluid
 AFI FV:      Within normal limits

                             Largest Pocket(cm)


 Comment:    No placental abruption or previa identified.
OB History

 Gravidity:    2         Term:   1        Prem:   0        SAB:   0
 TOP:          0       Ectopic:  0        Living: 1
Gestational Age

 LMP:           25w 4d        Date:  04/05/19                 EDD:   01/10/20
 Best:          25w 4d     Det. By:  LMP  (04/05/19)          EDD:   01/10/20
Anatomy
 Stomach:               Appears normal, left   Bladder:                Appears normal
                        sided
Cervix Uterus Adnexa

 Cervix
 Length:              4  cm.
 Normal appearance by transabdominal scan.

 Uterus
 No abnormality visualized.

 Adnexa
 No abnormality visualized.
Impression

 Patient was evaluated at the [facility] with abdominal trauma.
 A limited ultrasound study was performed. Antenatal testing
 is reassuring. BPP [DATE]. Placenta looks normal with no
 evidence of abruption. Ultrasound has limitations in
 diagnosing placental abruption.
                 Matse, Pleroma

## 2022-08-10 ENCOUNTER — Ambulatory Visit (INDEPENDENT_AMBULATORY_CARE_PROVIDER_SITE_OTHER): Admitting: Obstetrics & Gynecology

## 2022-08-10 ENCOUNTER — Encounter: Payer: Self-pay | Admitting: Obstetrics & Gynecology

## 2022-08-10 ENCOUNTER — Other Ambulatory Visit (HOSPITAL_COMMUNITY)
Admission: RE | Admit: 2022-08-10 | Discharge: 2022-08-10 | Disposition: A | Discharge status: Home / Self Care | Source: Ambulatory Visit | Attending: Obstetrics & Gynecology | Admitting: Obstetrics & Gynecology

## 2022-08-10 VITALS — BP 110/76 | HR 66 | Ht 59.0 in | Wt 242.0 lb

## 2022-08-10 DIAGNOSIS — Z30011 Encounter for initial prescription of contraceptive pills: Secondary | ICD-10-CM | POA: Diagnosis not present

## 2022-08-10 DIAGNOSIS — Z6841 Body Mass Index (BMI) 40.0 and over, adult: Secondary | ICD-10-CM

## 2022-08-10 DIAGNOSIS — N939 Abnormal uterine and vaginal bleeding, unspecified: Secondary | ICD-10-CM

## 2022-08-10 DIAGNOSIS — Z124 Encounter for screening for malignant neoplasm of cervix: Secondary | ICD-10-CM

## 2022-08-10 MED ORDER — DROSPIRENONE-ETHINYL ESTRADIOL 3-0.02 MG PO TABS: 1.0000 | ORAL_TABLET | Freq: Every day | ORAL | 4 refills | Status: DC

## 2022-08-10 NOTE — Progress Notes (Signed)
   GYN VISIT Patient name: Kim Park MRN [id number]  Date of birth: [birth year] Chief Complaint:   Menstrual Problem (PCOS, discuss tubal)  History of Present Illness:   Kim Park is a 31 y.o. G65P2002 female being seen today for the following concerns:      -AUB: Ongoing issue since the birth of her last child.  When she is not pregnant, she was on OCPs, which did help to regulate her period.  She is currently not on contraception and notes irregular periods.  Sometimes 2-3 periods in a month to skipping periods for a few mos.  Denies pelvic or abdominal pain.  Denies irregular discharge, itching or irritation.  No other acute concerns.  Patient's last menstrual period was 08/10/2022.      No data to display           Review of Systems:   Pertinent items are noted in HPI Denies fever/chills, dizziness, headaches, visual disturbances, fatigue, shortness of breath, chest pain, abdominal pain, vomiting, no problems with bowel movements, urination, or intercourse unless otherwise stated above.  Pertinent History Reviewed:  Reviewed past medical,surgical, social, obstetrical and family history.  Reviewed problem list, medications and allergies. Physical Assessment:   Vitals:   08/10/22 1053  BP: 110/76  Pulse: 66  Weight: 242 lb (109.8 kg)  Height: 4\' 11"  (1.499 m)  Body mass index is 48.88 kg/m.       Physical Examination:   General appearance: alert, well appearing, and in no distress  Psych: mood appropriate, normal affect  Skin: warm & dry   Cardiovascular: normal heart rate noted  Respiratory: normal respiratory effort, no distress  Abdomen: obese, soft, non-tender   Pelvic: VULVA: normal appearing vulva with no masses, tenderness or lesions, VAGINA: normal appearing vagina with normal color and discharge, no lesions- dark blood note din vault consistent with menses CERVIX: normal appearing cervix without discharge or lesions, Pap/HPV collected UTERUS:  uterus is normal size, shape, consistency and nontender, ADNEXA: normal adnexa in size, nontender and no masses- exam limited due to body habitus  Extremities: no edema   Chaperone:    Assessment & Plan:  1) AUB, Contraceptive management -plan to restart OCPs, f/u in 3 mos OCP risk assessment: Pt denies personal history of VTE, stroke or heart attack.  Denies personal h/o breast cancer.  Pt is either a non-smoker or smoker under the age of 29yo.  Denies h/o migraines with aura  2) Preventive screening -pap collected, reviewed ASCCP guidelines  Meds ordered this encounter  Medications   drospirenone-ethinyl estradiol (YAZ) 3-0.02 MG tablet    Sig: Take 1 tablet by mouth daily.    Dispense:  90 tablet    Refill:  4      Return in about 3 months (around 11/09/2022) for Medication follow up- ok for televisit.   11/11/2022, DO Attending Obstetrician & Gynecologist, [facility] for [facility]

## 2022-08-14 LAB — CYTOLOGY - PAP
Comment: NEGATIVE
Diagnosis: NEGATIVE
High risk HPV: NEGATIVE

## 2022-10-10 ENCOUNTER — Ambulatory Visit: Admitting: Obstetrics & Gynecology

## 2023-09-10 LAB — OB RESULTS CONSOLE HEPATITIS B SURFACE ANTIGEN: Hepatitis B Surface Ag: NEGATIVE

## 2023-09-10 LAB — OB RESULTS CONSOLE RPR: RPR: NONREACTIVE

## 2023-09-10 LAB — OB RESULTS CONSOLE GC/CHLAMYDIA
Chlamydia: NEGATIVE
Neisseria Gonorrhea: NEGATIVE

## 2023-09-10 LAB — OB RESULTS CONSOLE RUBELLA ANTIBODY, IGM: Rubella: NON-IMMUNE/NOT IMMUNE

## 2023-09-10 LAB — HEPATITIS C ANTIBODY: HCV Ab: NEGATIVE

## 2023-09-10 LAB — OB RESULTS CONSOLE HIV ANTIBODY (ROUTINE TESTING): HIV: NONREACTIVE

## 2023-12-18 ENCOUNTER — Inpatient Hospital Stay (HOSPITAL_COMMUNITY)

## 2023-12-18 ENCOUNTER — Inpatient Hospital Stay (HOSPITAL_COMMUNITY)
Admission: AD | Admit: 2023-12-18 | Discharge: 2023-12-18 | Disposition: A | Discharge status: Home / Self Care | Attending: Student | Admitting: Student

## 2023-12-18 ENCOUNTER — Encounter (HOSPITAL_COMMUNITY): Payer: Self-pay

## 2023-12-18 DIAGNOSIS — J101 Influenza due to other identified influenza virus with other respiratory manifestations: Secondary | ICD-10-CM | POA: Insufficient documentation

## 2023-12-18 DIAGNOSIS — O98512 Other viral diseases complicating pregnancy, second trimester: Secondary | ICD-10-CM | POA: Diagnosis not present

## 2023-12-18 DIAGNOSIS — Z1152 Encounter for screening for COVID-19: Secondary | ICD-10-CM | POA: Diagnosis not present

## 2023-12-18 DIAGNOSIS — O99512 Diseases of the respiratory system complicating pregnancy, second trimester: Secondary | ICD-10-CM | POA: Diagnosis present

## 2023-12-18 DIAGNOSIS — Z3A23 23 weeks gestation of pregnancy: Secondary | ICD-10-CM

## 2023-12-18 DIAGNOSIS — R051 Acute cough: Secondary | ICD-10-CM

## 2023-12-18 DIAGNOSIS — Z3A24 24 weeks gestation of pregnancy: Secondary | ICD-10-CM | POA: Diagnosis not present

## 2023-12-18 LAB — BASIC METABOLIC PANEL
Anion gap: 9 (ref 5–15)
BUN: <5 mg/dL — ABNORMAL LOW (ref 6–20)
CO2: 22 mmol/L (ref 22–32)
Calcium: 8.1 mg/dL — ABNORMAL LOW (ref 8.9–10.3)
Chloride: 104 mmol/L (ref 98–111)
Creatinine, Ser: 0.56 mg/dL (ref 0.44–1.00)
GFR, Estimated: >60 mL/min (ref >60)
Glucose, Bld: 91 mg/dL (ref 70–99)
Potassium: 3.7 mmol/L (ref 3.5–5.1)
Sodium: 135 mmol/L (ref 135–145)

## 2023-12-18 LAB — RESP PANEL BY RT-PCR (RSV, FLU A&B, COVID)  RVPGX2
Influenza A by PCR: POSITIVE — AB
Influenza B by PCR: NEGATIVE
Resp Syncytial Virus by PCR: NEGATIVE
SARS Coronavirus 2 by RT PCR: NEGATIVE

## 2023-12-18 LAB — URINALYSIS, ROUTINE W REFLEX MICROSCOPIC
Bilirubin Urine: NEGATIVE
Glucose, UA: NEGATIVE mg/dL
Hgb urine dipstick: NEGATIVE
Ketones, ur: NEGATIVE mg/dL
Leukocytes,Ua: NEGATIVE
Nitrite: NEGATIVE
Protein, ur: NEGATIVE mg/dL
Specific Gravity, Urine: 1.008 (ref 1.005–1.030)
pH: 6 (ref 5.0–8.0)

## 2023-12-18 LAB — CBC
HCT: 34.4 % — ABNORMAL LOW (ref 36.0–46.0)
Hemoglobin: 11.6 g/dL — ABNORMAL LOW (ref 12.0–15.0)
MCH: 29.3 pg (ref 26.0–34.0)
MCHC: 33.7 g/dL (ref 30.0–36.0)
MCV: 86.9 fL (ref 80.0–100.0)
Platelets: 142 10*3/uL — ABNORMAL LOW (ref 150–400)
RBC: 3.96 MIL/uL (ref 3.87–5.11)
RDW: 13.4 % (ref 11.5–15.5)
WBC: 6.1 10*3/uL (ref 4.0–10.5)
nRBC: 0 % (ref 0.0–0.2)

## 2023-12-18 MED ORDER — LACTATED RINGERS IV BOLUS
1000.0000 mL | Freq: Once | INTRAVENOUS | Status: AC
  Administered 2023-12-18: 1000 mL via INTRAVENOUS

## 2023-12-18 MED ORDER — ONDANSETRON HCL 4 MG/2ML IJ SOLN
4.0000 mg | Freq: Once | INTRAMUSCULAR | Status: AC
  Administered 2023-12-18: 4 mg via INTRAVENOUS
  Filled 2023-12-18: qty 2

## 2023-12-18 MED ORDER — GUAIFENESIN 100 MG/5ML PO LIQD
5.0000 mL | Freq: Once | ORAL | Status: AC
  Administered 2023-12-18: 5 mL via ORAL
  Filled 2023-12-18: qty 15

## 2023-12-18 MED ORDER — OSELTAMIVIR PHOSPHATE 75 MG PO CAPS: 75.0000 mg | ORAL_CAPSULE | Freq: Two times a day (BID) | ORAL | 0 refills | Status: DC

## 2023-12-18 NOTE — MAU Note (Addendum)
Pt is a G3P2 at 23.6 weeks c/o fever, cough, N&V for the last 3 days.  Has vomited 10+ times in last 24 hours.  Reports unable to keep liquid down.   Has significant h/o respiratory infections, this pregnancy.  Previous hospitalization for COVID prior to pregnancy.    Is a Runner, broadcasting/film/video.

## 2023-12-18 NOTE — MAU Provider Note (Signed)
Chief Complaint:  Vomiting   HPI   Event Date/Time   First Provider Initiated Contact with Patient 12/18/23 1228      Kim Park is a 33 y.o. [id number] at [redacted]w[redacted]d who presents to maternity admissions reporting Fever/Chills/N/V and a productive cough with nasal congestion. Patient reports that her couth and symptoms started 5 days ago and have progressively gotten worse with a fever yesterday at 102.7, resolved with Tylenol. Took Tylenol this AM d/t fever again but only took 500 mg. She is also concerned that when she is coughing she is having some increased vaginal discharge with involuntary urination. Denies any VB, LOF, and reports good FM's. She is a Runner, broadcasting/film/video   Pregnancy Course: [facility]  Past Medical History:  Diagnosis Date   GERD (gastroesophageal reflux disease)    Hypothyroidism    PCOS (polycystic ovarian syndrome)    Post traumatic stress disorder (PTSD)    OB History  Gravida Para Term Preterm AB Living  3 2 2   2   SAB IAB Ectopic Multiple Live Births     0 2    # Outcome Date GA Lbr Len/2nd Weight Sex Type Anes PTL Lv  3 Current           2 Term 01/09/20 [redacted]w[redacted]d  3680 g M CS-LVertical EPI  LIV  1 Term 08/22/17   3062 g M CS-LTranv EPI N LIV   Past Surgical History:  Procedure Laterality Date   APPENDECTOMY     CESAREAN SECTION     CESAREAN SECTION N/A 01/09/2020   Procedure: CESAREAN SECTION;  Surgeon: Lavina Hamman, MD;  Location: [facility] LD ORS;  Service: Obstetrics;  Laterality: N/A;   EYE SURGERY     TONSILLECTOMY     Family History  Problem Relation Age of Onset   Ovarian cancer Mother    Thyroid cancer Mother    Breast cancer Mother    Stroke Father    Hypertension Father    Diabetes Father    Social History   Tobacco Use   Smoking status: Never   Smokeless tobacco: Never  Vaping Use   Vaping status: Never Used  Substance Use Topics   Alcohol use: Never   Drug use: Never   No Known Allergies No medications prior to admission.    I have  reviewed patient's Past Medical Hx, Surgical Hx, Family Hx, Social Hx, medications and allergies.   ROS  Pertinent items noted in HPI and remainder of comprehensive ROS otherwise negative.   PHYSICAL EXAM  Patient Vitals for the past 24 hrs:  BP Temp Temp src Pulse Resp SpO2  12/18/23 1511 (!) 96/53 100.1 F (37.8 C) Oral (!) 114 (!) 24 --  12/18/23 1215 -- -- -- -- (!) 22 99 %  12/18/23 1213 -- 99.3 F (37.4 C) Oral -- -- --  12/18/23 1210 106/66 -- -- (!) 113 -- 100 %    Constitutional: Well-developed, well-nourished female who appears unwell and pale observed vomitting Cardiovascular: normal rate & rhythm, warm and well-perfused Respiratory: labored, Dyspnea, B/L lung sounds with crackles - Coughing upon deep breath difficult for patient to inhale deeply without vomiting (difficult exam) GI: Abd soft, non-tender, non-distended MS: Extremities nontender, no edema, normal ROM Neurologic: Alert and oriented x 4.  GU: no CVA tenderness Pelvic: Deferred     FHR: 155   Labs: Results for orders placed or performed during the hospital encounter of 12/18/23 (from the past 24 hours)  CBC  Status: Abnormal   Collection Time: 12/18/23 12:35 PM  Result Value Ref Range   WBC 6.1 4.0 - 10.5 K/uL   RBC 3.96 3.87 - 5.11 MIL/uL   Hemoglobin 11.6 (L) 12.0 - 15.0 g/dL   HCT 16.1 (L) 09.6 - 04.5 %   MCV 86.9 80.0 - 100.0 fL   MCH 29.3 26.0 - 34.0 pg   MCHC 33.7 30.0 - 36.0 g/dL   RDW 40.9 81.1 - 91.4 %   Platelets 142 (L) 150 - 400 K/uL   nRBC 0.0 0.0 - 0.2 %  Basic metabolic panel     Status: Abnormal   Collection Time: 12/18/23 12:35 PM  Result Value Ref Range   Sodium 135 135 - 145 mmol/L   Potassium 3.7 3.5 - 5.1 mmol/L   Chloride 104 98 - 111 mmol/L   CO2 22 22 - 32 mmol/L   Glucose, Bld 91 70 - 99 mg/dL   BUN <5 (L) 6 - 20 mg/dL   Creatinine, Ser 7.82 0.44 - 1.00 mg/dL   Calcium 8.1 (L) 8.9 - 10.3 mg/dL   GFR, Estimated >95 >62 mL/min   Anion gap 9 5 - 15  Urinalysis,  Routine w reflex microscopic -Urine, Clean Catch     Status: Abnormal   Collection Time: 12/18/23 12:39 PM  Result Value Ref Range   Color, Urine YELLOW YELLOW   APPearance HAZY (A) CLEAR   Specific Gravity, Urine 1.008 1.005 - 1.030   pH 6.0 5.0 - 8.0   Glucose, UA NEGATIVE NEGATIVE mg/dL   Hgb urine dipstick NEGATIVE NEGATIVE   Bilirubin Urine NEGATIVE NEGATIVE   Ketones, ur NEGATIVE NEGATIVE mg/dL   Protein, ur NEGATIVE NEGATIVE mg/dL   Nitrite NEGATIVE NEGATIVE   Leukocytes,Ua NEGATIVE NEGATIVE  Resp panel by RT-PCR (RSV, Flu A&B, Covid) Anterior Nasal Swab     Status: Abnormal   Collection Time: 12/18/23 12:40 PM   Specimen: Anterior Nasal Swab  Result Value Ref Range   SARS Coronavirus 2 by RT PCR NEGATIVE NEGATIVE   Influenza A by PCR POSITIVE (A) NEGATIVE   Influenza B by PCR NEGATIVE NEGATIVE   Resp Syncytial Virus by PCR NEGATIVE NEGATIVE    Imaging:  DG Chest Port 1 View Result Date: 12/18/2023 CLINICAL DATA:  Cough, fever.  Twenty-four weeks pregnant. EXAM: PORTABLE CHEST 1 VIEW COMPARISON:  None Available. FINDINGS: The heart size and mediastinal contours are within normal limits. Both lungs are clear. The visualized skeletal structures are unremarkable. IMPRESSION: No active disease. Electronically Signed   By: Obie Dredge M.D.   On: 12/18/2023 13:32    MDM & MAU COURSE  MDM:  HIGH   I have reviewed the patient chart and performed the physical exam . I have ordered & interpreted the lab results and reviewed and interpreted the CXR Medications ordered as stated below.  A/P as described below.  Counseling and education provided and patient agreeable  with plan as described below. Verbalized understanding.    MAU Course: Orders Placed This Encounter  Procedures   Resp panel by RT-PCR (RSV, Flu A&B, Covid) Anterior Nasal Swab   DG Chest Port 1 View   CBC   Basic metabolic panel   Urinalysis, Routine w reflex microscopic -Urine, Clean Catch   Airborne /  Contact (Orange) Isolation   Discharge patient   Meds ordered this encounter  Medications   lactated ringers bolus 1,000 mL   ondansetron (ZOFRAN) injection 4 mg   guaiFENesin (ROBITUSSIN) 100 MG/5ML liquid 5  mL   oseltamivir (TAMIFLU) 75 MG capsule    Sig: Take 1 capsule (75 mg total) by mouth every 12 (twelve) hours.    Dispense:  10 capsule    Refill:  0    Supervising Provider:   Reva Bores [[id number]]    ASSESSMENT   1. Influenza A   2. [redacted] weeks gestation of pregnancy   3. Acute cough     PLAN  Discharge home in stable condition with return precautions.      Allergies as of 12/18/2023   No Known Allergies      Medication List     STOP taking these medications    drospirenone-ethinyl estradiol 3-0.02 MG tablet Commonly known as: YAZ   lamoTRIgine 100 MG tablet Commonly known as: LAMICTAL       TAKE these medications    cyanocobalamin 500 MCG tablet Commonly known as: VITAMIN B12 Take 500 mcg by mouth daily.   levothyroxine 50 MCG tablet Commonly known as: SYNTHROID Take 50 mcg by mouth daily before breakfast.   multivitamin-prenatal 27-0.8 MG Tabs tablet Take 1 tablet by mouth daily at 12 noon.   omeprazole 10 MG capsule Commonly known as: PRILOSEC Take 10 mg by mouth daily.   oseltamivir 75 MG capsule Commonly known as: TAMIFLU Take 1 capsule (75 mg total) by mouth every 12 (twelve) hours.   prenatal multivitamin Tabs tablet Take 1 tablet by mouth daily at 12 noon.   sertraline 50 MG tablet Commonly known as: ZOLOFT Take 50 mg by mouth in the morning and at bedtime.   sertraline 100 MG tablet Commonly known as: ZOLOFT TAKE ONE AND ONE-HALF TABLETS BY MOUTH EVERY DAY FOR MOOD       Marcell Barlow, MSN, [facility], [facility] for [employer]

## 2024-02-07 ENCOUNTER — Inpatient Hospital Stay (HOSPITAL_COMMUNITY)
Admission: AD | Admit: 2024-02-07 | Discharge: 2024-02-07 | Disposition: A | Discharge status: Home / Self Care | Attending: Obstetrics and Gynecology | Admitting: Obstetrics and Gynecology

## 2024-02-07 ENCOUNTER — Encounter (HOSPITAL_COMMUNITY): Payer: Self-pay | Admitting: Student

## 2024-02-07 DIAGNOSIS — O26893 Other specified pregnancy related conditions, third trimester: Secondary | ICD-10-CM | POA: Diagnosis not present

## 2024-02-07 DIAGNOSIS — R102 Pelvic and perineal pain: Secondary | ICD-10-CM | POA: Insufficient documentation

## 2024-02-07 DIAGNOSIS — Z3A31 31 weeks gestation of pregnancy: Secondary | ICD-10-CM | POA: Diagnosis not present

## 2024-02-07 DIAGNOSIS — O26899 Other specified pregnancy related conditions, unspecified trimester: Secondary | ICD-10-CM

## 2024-02-07 DIAGNOSIS — O368130 Decreased fetal movements, third trimester, not applicable or unspecified: Secondary | ICD-10-CM | POA: Diagnosis present

## 2024-02-07 DIAGNOSIS — Z3689 Encounter for other specified antenatal screening: Secondary | ICD-10-CM

## 2024-02-07 HISTORY — DX: Urinary tract infection, site not specified: N39.0

## 2024-02-07 HISTORY — DX: Headache, unspecified: R51.9

## 2024-02-07 HISTORY — DX: Unspecified abnormal cytological findings in specimens from vagina: R87.629

## 2024-02-07 LAB — RUPTURE OF MEMBRANE (ROM)PLUS: Rom Plus: NEGATIVE

## 2024-02-07 LAB — WET PREP, GENITAL
Clue Cells Wet Prep HPF POC: NONE SEEN
Sperm: NONE SEEN
Trich, Wet Prep: NONE SEEN
WBC, Wet Prep HPF POC: >=10 — AB (ref <10)
Yeast Wet Prep HPF POC: NONE SEEN

## 2024-02-07 LAB — POCT FERN TEST: POCT Fern Test: NEGATIVE

## 2024-02-07 NOTE — MAU Provider Note (Signed)
 Chief Complaint:  Decreased Fetal Movement, Rupture of Membranes, and Vaginal Pain  HPI   Event Date/Time   First Provider Initiated Contact with Patient 02/07/24 1557     Kim Park is a 33 y.o. [id number] at [redacted]w[redacted]d who presents to maternity admissions reporting possible LOF today and increased pelvic pressure. Last IC was last night. Also having decreased fetal movement. No other physical complaints.  Pregnancy Course: Receives OB care at [facility].  Past Medical History:  Diagnosis Date   GERD (gastroesophageal reflux disease)    Headache    Hypothyroidism    PCOS (polycystic ovarian syndrome)    Post traumatic stress disorder (PTSD)    UTI (urinary tract infection)    Vaginal Pap smear, abnormal    age 27, ok since   OB History  Gravida Para Term Preterm AB Living  3 2 2   2   SAB IAB Ectopic Multiple Live Births     0 2    # Outcome Date GA Lbr Len/2nd Weight Sex Type Anes PTL Lv  3 Current           2 Term 01/09/20 [redacted]w[redacted]d  8 lb 1.8 oz (3.68 kg) M CS-LVertical EPI  LIV  1 Term 08/22/17   6 lb 12 oz (3.062 kg) M CS-LTranv EPI N LIV   Past Surgical History:  Procedure Laterality Date   APPENDECTOMY     CESAREAN SECTION     CESAREAN SECTION N/A 01/09/2020   Procedure: CESAREAN SECTION;  Surgeon: Lavina Hamman, MD;  [facility]: [facility];  Service: Obstetrics;  Laterality: N/A;   EYE SURGERY     TONSILLECTOMY     Family History  Problem Relation Age of Onset   Ovarian cancer Mother    Thyroid cancer Mother    Breast cancer Mother    Diverticulitis Mother    Stroke Father    Hypertension Father    Diabetes Father    Alcoholism Father    Social History   Tobacco Use   Smoking status: Never   Smokeless tobacco: Never  Vaping Use   Vaping status: Never Used  Substance Use Topics   Alcohol use: Not Currently   Drug use: Never   Allergies  Allergen Reactions   Prednisone Swelling    Mouth and face   Medications Prior to Admission  Medication  Sig Dispense Refill Last Dose/Taking   azithromycin (ZITHROMAX) 250 MG tablet Take 1 tablet by mouth daily. Z pack   02/06/2024   levothyroxine (SYNTHROID) 50 MCG tablet Take 50 mcg by mouth daily before breakfast.   02/07/2024 Morning   omeprazole (PRILOSEC) 10 MG capsule Take 10 mg by mouth daily.   02/07/2024 Morning   Prenatal Vit-Fe Fumarate-FA (MULTIVITAMIN-PRENATAL) 27-0.8 MG TABS tablet Take 1 tablet by mouth daily at 12 noon.   02/07/2024 Morning   sertraline (ZOLOFT) 100 MG tablet TAKE ONE AND ONE-HALF TABLETS BY MOUTH EVERY DAY FOR MOOD   02/07/2024 Morning   sertraline (ZOLOFT) 50 MG tablet Take 50 mg by mouth in the morning and at bedtime.    02/07/2024 Morning   oseltamivir (TAMIFLU) 75 MG capsule Take 1 capsule (75 mg total) by mouth every 12 (twelve) hours. 10 capsule 0    Prenatal Vit-Fe Fumarate-FA (PRENATAL MULTIVITAMIN) TABS tablet Take 1 tablet by mouth daily at 12 noon.      vitamin B-12 (CYANOCOBALAMIN) 500 MCG tablet Take 500 mcg by mouth daily. (Patient not taking: Reported on 08/10/2022)   Not Taking  I have reviewed patient's Past Medical Hx, Surgical Hx, Family Hx, Social Hx, medications and allergies.   ROS  Pertinent items noted in HPI and remainder of comprehensive ROS otherwise negative.   PHYSICAL EXAM  Patient Vitals for the past 24 hrs:  BP Temp Temp src Pulse Resp SpO2  02/07/24 1618 112/74 -- -- 86 -- --  02/07/24 1527 104/64 98.2 F (36.8 C) Oral 100 20 --  02/07/24 1525 -- -- -- -- -- 100 %   Constitutional: Well-developed, well-nourished female in no acute distress.  Cardiovascular: normal rate & rhythm, warm and well-perfused Respiratory: normal effort, no problems with respiration noted GI: Abd soft, non-tender, non-distended MS: Extremities nontender, no edema, normal ROM Neurologic: Alert and oriented x 4.  GU: no CVA tenderness Pelvic: NEFG, physiologic discharge, no blood, cervix clean.   Dilation: Fingertip Cervical Position: Posterior Exam  by:: Jaelyn Cloninger, J  Fetal Tracing: reactive, movement immediately on application of monitors Baseline: 130 Variability: moderate Accelerations: 15x15 Decelerations: none Toco: UI   Labs: Results for orders placed or performed during the hospital encounter of 02/07/24 (from the past 24 hours)  Fern Test     Status: None   Collection Time: 02/07/24  3:58 PM  Result Value Ref Range   POCT Fern Test Negative = intact amniotic membranes   Wet prep, genital     Status: Abnormal   Collection Time: 02/07/24  4:17 PM   Specimen: Vaginal  Result Value Ref Range   Yeast Wet Prep HPF POC NONE SEEN NONE SEEN   Trich, Wet Prep NONE SEEN NONE SEEN   Clue Cells Wet Prep HPF POC NONE SEEN NONE SEEN   WBC, Wet Prep HPF POC >=10 (A) <10   Sperm NONE SEEN   Rupture of Membrane (ROM) Plus     Status: None   Collection Time: 02/07/24  4:17 PM  Result Value Ref Range   Rom Plus NEGATIVE    Imaging:  No results found.  MDM & MAU COURSE  MDM: Low  MAU Course: Orders Placed This Encounter  Procedures   Wet prep, genital   Rupture of Membrane (ROM) Plus   Fern Test   Discharge patient   No orders of the defined types were placed in this encounter.  Cervix closed, wet prep, fern and ROM+ were negative.  Positive fetal movement and reactive NST on arrival to MAU. Educated on normalcy of post-coital discharge.   ASSESSMENT   1. Pelvic pressure in pregnancy   2. NST (non-stress test) reactive   3. [redacted] weeks gestation of pregnancy    PLAN  Discharge home in stable condition with return precautions.     Follow-up Information     Ob/Gyn, [facility] Follow up.   Why: as scheduled for ongoing prenatal care Contact information: [address] [place] [address] [phone]                 Allergies as of 02/07/2024       Reactions   Prednisone Swelling   Mouth and face        Medication List     TAKE these medications    azithromycin 250 MG  tablet Commonly known as: ZITHROMAX Take 1 tablet by mouth daily. Z pack   cyanocobalamin 500 MCG tablet Commonly known as: VITAMIN B12 Take 500 mcg by mouth daily.   levothyroxine 50 MCG tablet Commonly known as: SYNTHROID Take 50 mcg by mouth daily before breakfast.   multivitamin-prenatal  27-0.8 MG Tabs tablet Take 1 tablet by mouth daily at 12 noon.   omeprazole 10 MG capsule Commonly known as: PRILOSEC Take 10 mg by mouth daily.   oseltamivir 75 MG capsule Commonly known as: TAMIFLU Take 1 capsule (75 mg total) by mouth every 12 (twelve) hours.   prenatal multivitamin Tabs tablet Take 1 tablet by mouth daily at 12 noon.   sertraline 50 MG tablet Commonly known as: ZOLOFT Take 50 mg by mouth in the morning and at bedtime.   sertraline 100 MG tablet Commonly known as: ZOLOFT TAKE ONE AND ONE-HALF TABLETS BY MOUTH EVERY DAY FOR MOOD        Edd Arbour, CNM, MSN, IBCLC Certified Nurse Midwife, [facility]

## 2024-02-07 NOTE — MAU Note (Signed)
 Kim Park is a 33 y.o. at [redacted]w[redacted]d here in MAU reporting: baby not moving as much as usual.  Drank several bottles of water, went for a walk.  Still wasn't moving.  On the way here, she felt some movement. Reports increase in vag pain/pressure and d/c  ? Leaking.  Did have intercourse last night.   No bleeding. Marker given for fetal movement. Onset of complaint: this morning Pain score: mild Vitals:   02/07/24 1525 02/07/24 1527  BP:  104/64  Pulse:  100  Resp:  20  Temp:  98.2 F (36.8 C)  SpO2: 100%      FHT:150 Lab orders placed from triage:

## 2024-02-10 LAB — GC/CHLAMYDIA PROBE AMP (~~LOC~~) NOT AT ARMC
Chlamydia: NEGATIVE
Comment: NEGATIVE
Comment: NORMAL
Neisseria Gonorrhea: NEGATIVE

## 2024-02-25 ENCOUNTER — Other Ambulatory Visit: Payer: Self-pay

## 2024-02-25 ENCOUNTER — Other Ambulatory Visit: Payer: Self-pay | Admitting: Student

## 2024-02-25 ENCOUNTER — Ambulatory Visit: Attending: Student

## 2024-02-25 DIAGNOSIS — O365990 Maternal care for other known or suspected poor fetal growth, unspecified trimester, not applicable or unspecified: Secondary | ICD-10-CM | POA: Insufficient documentation

## 2024-02-25 DIAGNOSIS — E039 Hypothyroidism, unspecified: Secondary | ICD-10-CM | POA: Insufficient documentation

## 2024-02-25 DIAGNOSIS — Z818 Family history of other mental and behavioral disorders: Secondary | ICD-10-CM | POA: Insufficient documentation

## 2024-02-25 DIAGNOSIS — O365930 Maternal care for other known or suspected poor fetal growth, third trimester, not applicable or unspecified: Secondary | ICD-10-CM

## 2024-02-25 DIAGNOSIS — O99210 Obesity complicating pregnancy, unspecified trimester: Secondary | ICD-10-CM | POA: Insufficient documentation

## 2024-02-25 DIAGNOSIS — Z8279 Family history of other congenital malformations, deformations and chromosomal abnormalities: Secondary | ICD-10-CM | POA: Insufficient documentation

## 2024-02-25 DIAGNOSIS — F4310 Post-traumatic stress disorder, unspecified: Secondary | ICD-10-CM | POA: Insufficient documentation

## 2024-02-27 ENCOUNTER — Ambulatory Visit (HOSPITAL_BASED_OUTPATIENT_CLINIC_OR_DEPARTMENT_OTHER): Admitting: Maternal & Fetal Medicine

## 2024-02-27 ENCOUNTER — Ambulatory Visit: Attending: Student

## 2024-02-27 VITALS — BP 113/73 | HR 108

## 2024-02-27 DIAGNOSIS — Z3A34 34 weeks gestation of pregnancy: Secondary | ICD-10-CM | POA: Diagnosis present

## 2024-02-27 DIAGNOSIS — Z818 Family history of other mental and behavioral disorders: Secondary | ICD-10-CM | POA: Insufficient documentation

## 2024-02-27 DIAGNOSIS — O99280 Endocrine, nutritional and metabolic diseases complicating pregnancy, unspecified trimester: Secondary | ICD-10-CM | POA: Diagnosis present

## 2024-02-27 DIAGNOSIS — O99210 Obesity complicating pregnancy, unspecified trimester: Secondary | ICD-10-CM | POA: Diagnosis not present

## 2024-02-27 DIAGNOSIS — E669 Obesity, unspecified: Secondary | ICD-10-CM

## 2024-02-27 DIAGNOSIS — E079 Disorder of thyroid, unspecified: Secondary | ICD-10-CM

## 2024-02-27 DIAGNOSIS — O99213 Obesity complicating pregnancy, third trimester: Secondary | ICD-10-CM | POA: Diagnosis not present

## 2024-02-27 DIAGNOSIS — E039 Hypothyroidism, unspecified: Secondary | ICD-10-CM

## 2024-02-27 DIAGNOSIS — F4310 Post-traumatic stress disorder, unspecified: Secondary | ICD-10-CM | POA: Insufficient documentation

## 2024-02-27 DIAGNOSIS — O365930 Maternal care for other known or suspected poor fetal growth, third trimester, not applicable or unspecified: Secondary | ICD-10-CM | POA: Insufficient documentation

## 2024-02-27 DIAGNOSIS — O365990 Maternal care for other known or suspected poor fetal growth, unspecified trimester, not applicable or unspecified: Secondary | ICD-10-CM | POA: Insufficient documentation

## 2024-02-27 DIAGNOSIS — O34219 Maternal care for unspecified type scar from previous cesarean delivery: Secondary | ICD-10-CM | POA: Diagnosis not present

## 2024-02-27 DIAGNOSIS — Z8279 Family history of other congenital malformations, deformations and chromosomal abnormalities: Secondary | ICD-10-CM | POA: Insufficient documentation

## 2024-02-27 DIAGNOSIS — O99283 Endocrine, nutritional and metabolic diseases complicating pregnancy, third trimester: Secondary | ICD-10-CM

## 2024-02-27 NOTE — Progress Notes (Signed)
 Patient information  Patient Name: Kim Park [facility]  Patient MRN:   [id number]  Referring practice: MFM Referring Provider: Willodean Rosenthal  MFM CONSULT  [name] [facility] is a 33 y.o. G3P2002 at [redacted]w[redacted]d here for ultrasound and consultation. 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  RE FGR: The patient was referred by her OB provider for concern for fetal growth restriction due to the abdominal circumference measuring at the 8th percentile.  Today normal measurements were obtained at the abdominal circumference and overall.  I discussed that there are limitations to the ultrasound measurements can be spurious due to maternal body habitus, fetal position and poor acoustic windows.  Typically the growth percentiles are within 10 to 15% but become increasingly less accurate as the pregnancy continues.  I discussed that I am pleased with the images today and they do reflect normal fetal biometry.  I encouraged the patient to discuss her findings with her OB provider and change her delivery plan accordingly.  At this time I do not think a 37-week delivery is indicated and she can go to 39 to 40 weeks as long as the fetal testing remains reassuring.  Sonographic findings Single intrauterine pregnancy at 34w 0d.  Fetal cardiac activity:  Observed and appears normal. Presentation: Cephalic. Interval fetal anatomy appears normal. Fetal biometry shows the estimated fetal weight at the 18 percentile, abdominal circumference at the 34th percentile. Amniotic fluid volume: Within normal limits. MVP: 5.25  cm. Placenta: Posterior Fundal. Normal fetal movement and tone was witnessed during ultrasound along with fetal breathing.   There are limitations of prenatal ultrasound such as the inability to detect certain abnormalities due to poor visualization. Various factors such as fetal position, gestational age and maternal body habitus may increase the difficulty in visualizing the fetal anatomy.    Recommendations -Follow-up growth ultrasound in 3 weeks with OB provider. -Antenatal testing to continue weekly. -Delivery around 39-40 weeks.   Review of Systems: A review of systems was performed and was negative except per HPI   Vitals and Physical Exam    02/27/2024   12:55 PM 02/07/2024    4:18 PM 02/07/2024    3:27 PM  Vitals with BMI  Systolic 113 112 962  Diastolic 73 74 64  Pulse 108 86 100    Sitting comfortably on the sonogram table Nonlabored breathing Normal rate and rhythm Abdomen is nontender  Past pregnancies OB History  Gravida Para Term Preterm AB Living  3 2 2   2   SAB IAB Ectopic Multiple Live Births     0 2    # Outcome Date GA Lbr Len/2nd Weight Sex Type Anes PTL Lv  3 Current           2 Term 01/09/20 [redacted]w[redacted]d  8 lb 1.8 oz (3.68 kg) M CS-LVertical EPI  LIV  1 Term 08/22/17   6 lb 12 oz (3.062 kg) M CS-LTranv EPI N LIV    I spent 30 minutes reviewing the patients chart, including labs and images as well as counseling the patient about her medical conditions. Greater than 50% of the  time was spent in direct face-to-face patient counseling.  Braxton Feathers  MFM, [facility]   02/27/2024  2:19 PM

## 2024-03-16 ENCOUNTER — Encounter (HOSPITAL_COMMUNITY): Payer: Self-pay

## 2024-03-16 ENCOUNTER — Telehealth (HOSPITAL_COMMUNITY): Payer: Self-pay | Admitting: *Deleted

## 2024-03-16 NOTE — Patient Instructions (Addendum)
 Kim Park  03/16/2024   Your procedure is scheduled on:  04/02/2024  Arrive at 0530 at Entrance C on [address] at [facility]  and [employer]. You are invited to use the FREE valet parking or use the Visitor's parking deck.  Pick up the phone at the desk and dial [phone].  Call this number if you have problems the morning of surgery: [phone]  Remember:   Do not eat food:(After Midnight) Desps de medianoche.  You may drink clear liquids until arrival at __0530___.  Clear liquids means a liquid you can see thru.  It can have color such as Cola or Kool aid.  Tea is OK and coffee as long as no milk or creamer of any kind.  Take these medicines the morning of surgery with A SIP OF WATER:  Levothyroxine  and zoloft    Do not wear jewelry, make-up or nail polish.  Do not wear lotions, powders, or perfumes. Do not wear deodorant.  Do not shave 48 hours prior to surgery.  Do not bring valuables to the hospital.  [facility] is not   responsible for any belongings or valuables brought to the hospital.  Contacts, dentures or bridgework may not be worn into surgery.  Leave suitcase in the car. After surgery it may be brought to your room.  For patients admitted to the hospital, checkout time is 11:00 AM the day of              discharge.      Please read over the following fact sheets that you were given:     Preparing for Surgery

## 2024-03-16 NOTE — Telephone Encounter (Signed)
 Preadmission screen

## 2024-03-18 ENCOUNTER — Encounter (HOSPITAL_COMMUNITY): Payer: Self-pay | Admitting: Student

## 2024-03-18 ENCOUNTER — Inpatient Hospital Stay (HOSPITAL_COMMUNITY)

## 2024-03-18 ENCOUNTER — Inpatient Hospital Stay (HOSPITAL_COMMUNITY)
Admission: AD | Admit: 2024-03-18 | Discharge: 2024-03-18 | Disposition: A | Discharge status: Home / Self Care | Attending: Obstetrics and Gynecology | Admitting: Obstetrics and Gynecology

## 2024-03-18 DIAGNOSIS — O365930 Maternal care for other known or suspected poor fetal growth, third trimester, not applicable or unspecified: Secondary | ICD-10-CM | POA: Diagnosis not present

## 2024-03-18 DIAGNOSIS — R102 Pelvic and perineal pain: Secondary | ICD-10-CM | POA: Diagnosis present

## 2024-03-18 DIAGNOSIS — E079 Disorder of thyroid, unspecified: Secondary | ICD-10-CM

## 2024-03-18 DIAGNOSIS — O99283 Endocrine, nutritional and metabolic diseases complicating pregnancy, third trimester: Secondary | ICD-10-CM | POA: Insufficient documentation

## 2024-03-18 DIAGNOSIS — M7989 Other specified soft tissue disorders: Secondary | ICD-10-CM

## 2024-03-18 DIAGNOSIS — O34218 Maternal care for other type scar from previous cesarean delivery: Secondary | ICD-10-CM | POA: Diagnosis not present

## 2024-03-18 DIAGNOSIS — O4703 False labor before 37 completed weeks of gestation, third trimester: Secondary | ICD-10-CM | POA: Diagnosis present

## 2024-03-18 DIAGNOSIS — R600 Localized edema: Secondary | ICD-10-CM | POA: Diagnosis not present

## 2024-03-18 DIAGNOSIS — Z3A36 36 weeks gestation of pregnancy: Secondary | ICD-10-CM

## 2024-03-18 DIAGNOSIS — N949 Unspecified condition associated with female genital organs and menstrual cycle: Secondary | ICD-10-CM

## 2024-03-18 DIAGNOSIS — E669 Obesity, unspecified: Secondary | ICD-10-CM | POA: Diagnosis not present

## 2024-03-18 DIAGNOSIS — N858 Other specified noninflammatory disorders of uterus: Secondary | ICD-10-CM | POA: Diagnosis not present

## 2024-03-18 DIAGNOSIS — O26893 Other specified pregnancy related conditions, third trimester: Secondary | ICD-10-CM | POA: Diagnosis not present

## 2024-03-18 DIAGNOSIS — O99213 Obesity complicating pregnancy, third trimester: Secondary | ICD-10-CM

## 2024-03-18 DIAGNOSIS — O34219 Maternal care for unspecified type scar from previous cesarean delivery: Secondary | ICD-10-CM | POA: Diagnosis not present

## 2024-03-18 DIAGNOSIS — O99820 Streptococcus B carrier state complicating pregnancy: Secondary | ICD-10-CM | POA: Diagnosis not present

## 2024-03-18 DIAGNOSIS — Z113 Encounter for screening for infections with a predominantly sexual mode of transmission: Secondary | ICD-10-CM | POA: Diagnosis present

## 2024-03-18 DIAGNOSIS — O288 Other abnormal findings on antenatal screening of mother: Secondary | ICD-10-CM | POA: Diagnosis not present

## 2024-03-18 LAB — COMPREHENSIVE METABOLIC PANEL WITH GFR
ALT: 11 U/L (ref 0–44)
AST: 14 U/L — ABNORMAL LOW (ref 15–41)
Albumin: 2.2 g/dL — ABNORMAL LOW (ref 3.5–5.0)
Alkaline Phosphatase: 147 U/L — ABNORMAL HIGH (ref 38–126)
Anion gap: 9 (ref 5–15)
BUN: 8 mg/dL (ref 6–20)
CO2: 20 mmol/L — ABNORMAL LOW (ref 22–32)
Calcium: 8.6 mg/dL — ABNORMAL LOW (ref 8.9–10.3)
Chloride: 107 mmol/L (ref 98–111)
Creatinine, Ser: 0.59 mg/dL (ref 0.44–1.00)
GFR, Estimated: >60 mL/min (ref >60)
Glucose, Bld: 99 mg/dL (ref 70–99)
Potassium: 3.7 mmol/L (ref 3.5–5.1)
Sodium: 136 mmol/L (ref 135–145)
Total Bilirubin: 0.3 mg/dL (ref 0.0–1.2)
Total Protein: 5.7 g/dL — ABNORMAL LOW (ref 6.5–8.1)

## 2024-03-18 LAB — URINALYSIS, ROUTINE W REFLEX MICROSCOPIC
Bilirubin Urine: NEGATIVE
Glucose, UA: 50 mg/dL — AB
Hgb urine dipstick: NEGATIVE
Ketones, ur: NEGATIVE mg/dL
Leukocytes,Ua: NEGATIVE
Nitrite: NEGATIVE
Protein, ur: NEGATIVE mg/dL
Specific Gravity, Urine: 1.006 (ref 1.005–1.030)
pH: 6 (ref 5.0–8.0)

## 2024-03-18 LAB — PROTEIN / CREATININE RATIO, URINE
Creatinine, Urine: 36 mg/dL
Total Protein, Urine: <6 mg/dL

## 2024-03-18 LAB — WET PREP, GENITAL
Clue Cells Wet Prep HPF POC: NONE SEEN
Sperm: NONE SEEN
Trich, Wet Prep: NONE SEEN
WBC, Wet Prep HPF POC: <10 (ref <10)
Yeast Wet Prep HPF POC: NONE SEEN

## 2024-03-18 LAB — CBC
HCT: 34.8 % — ABNORMAL LOW (ref 36.0–46.0)
Hemoglobin: 11.6 g/dL — ABNORMAL LOW (ref 12.0–15.0)
MCH: 28.3 pg (ref 26.0–34.0)
MCHC: 33.3 g/dL (ref 30.0–36.0)
MCV: 84.9 fL (ref 80.0–100.0)
Platelets: 176 10*3/uL (ref 150–400)
RBC: 4.1 MIL/uL (ref 3.87–5.11)
RDW: 13.4 % (ref 11.5–15.5)
WBC: 14.3 10*3/uL — ABNORMAL HIGH (ref 4.0–10.5)
nRBC: 0 % (ref 0.0–0.2)

## 2024-03-18 NOTE — MAU Note (Signed)
..  Kim Park is a 33 y.o. at [redacted]w[redacted]d here in MAU reporting: contractions and pressure which started yesterday. Reports occasional ctx roughly every 20 min apart 4/10 pain and pressure when standing and changing positions. Reports intercourse within last 24 hours but the pressure and ctx were present before the intercourse.   Denies leaking fluid, bleeding or discharge. Endorses +FM.   Planned repeat c/s scheduled May 8th. Hx of 2 prior c/s.    Vitals:   03/18/24 1708  BP: 119/80  Pulse: 96  Resp: 20  Temp: 98.5 F (36.9 C)     FHT:150 Lab orders placed from triage:  UA

## 2024-03-18 NOTE — MAU Provider Note (Addendum)
 Chief Complaint:  Contractions   HPI    Kim Park is a 33 y.o. [id number] at [redacted]w[redacted]d who presents to maternity admissions reporting ctx q 20 minutes that started yesterday with pelvic/vaginal pressure. She rates the pain 4/10 on pain scale.  She denies any VB, LOF, & reports good FM's. She does report recent IC within the last 24 hours. Offers no GI/GU complaints other than listed above.  She had 2 prior C- Sections and is scheduled for repeat C- Section on 04/02/24 and reports she is GBS positive  Pregnancy Course:  OB/GYN  Past Medical History:  Diagnosis Date   Family history of adverse reaction to anesthesia    mom has woken up and red heads in family   GERD (gastroesophageal reflux disease)    Headache    Hypothyroidism    PCOS (polycystic ovarian syndrome)    Post traumatic stress disorder (PTSD)    UTI (urinary tract infection)    Vaginal Pap smear, abnormal    age 8, ok since   OB History  Gravida Para Term Preterm AB Living  3 2 2   2   SAB IAB Ectopic Multiple Live Births     0 2    # Outcome Date GA Lbr Len/2nd Weight Sex Type Anes PTL Lv  3 Current           2 Term 01/09/20 [redacted]w[redacted]d  3680 g M CS-LVertical EPI  LIV  1 Term 08/22/17   3062 g M CS-LTranv EPI N LIV   Past Surgical History:  Procedure Laterality Date   APPENDECTOMY     CESAREAN SECTION     CESAREAN SECTION N/A 01/09/2020   Procedure: CESAREAN SECTION;  Surgeon: Cyd Dowse, MD;  Location: [facility] LD [facility]S;  Service: Obstetrics;  Laterality: N/A;   EYE SURGERY     TONSILLECTOMY     Family History  Problem Relation Age of Onset   Ovarian cancer Mother    Thyroid cancer Mother    Breast cancer Mother    Diverticulitis Mother    Stroke Father    Hypertension Father    Diabetes Father    Alcoholism Father    Social History   Tobacco Use   Smoking status: Never   Smokeless tobacco: Never  Vaping Use   Vaping status: Never Used  Substance Use Topics   Alcohol use: Not Currently    Drug use: Never   Allergies  Allergen Reactions   Prednisone Swelling    Mouth and face   Medications Prior to Admission  Medication Sig Dispense Refill Last Dose/Taking   levothyroxine  (SYNTHROID ) 50 MCG tablet Take 50 mcg by mouth daily before breakfast.   03/18/2024 Morning   omeprazole (PRILOSEC) 10 MG capsule Take 10 mg by mouth daily.   03/18/2024 Morning   Prenatal Vit-Fe Fumarate-FA (MULTIVITAMIN-PRENATAL) 27-0.8 MG TABS tablet Take 1 tablet by mouth daily at 12 noon.   03/17/2024 Morning   sertraline  (ZOLOFT ) 100 MG tablet TAKE ONE AND ONE-HALF TABLETS BY MOUTH EVERY DAY FOR MOOD   03/18/2024 Morning   azithromycin (ZITHROMAX) 250 MG tablet Take 1 tablet by mouth daily. Z pack      Prenatal Vit-Fe Fumarate-FA (PRENATAL MULTIVITAMIN) TABS tablet Take 1 tablet by mouth daily at 12 noon.      sertraline  (ZOLOFT ) 50 MG tablet Take 50 mg by mouth in the morning and at bedtime.  (Patient not taking: Reported on 02/27/2024)      vitamin B-12 (CYANOCOBALAMIN) 500 MCG tablet  Take 500 mcg by mouth daily. (Patient not taking: Reported on 08/10/2022)       I have reviewed patient's Past Medical Hx, Surgical Hx, Family Hx, Social Hx, medications and allergies.   ROS  Pertinent items noted in HPI and remainder of comprehensive ROS otherwise negative.   PHYSICAL EXAM  Patient Vitals for the past 24 hrs:  BP Temp Temp src Pulse Resp SpO2 Height Weight  03/18/24 1900 -- -- -- -- -- 100 % -- --  03/18/24 1830 119/83 -- -- (!) 104 -- 97 % -- --  03/18/24 1820 -- -- -- -- -- 98 % -- --  03/18/24 1814 -- -- -- -- -- 99 % -- --  03/18/24 1810 -- -- -- -- -- 98 % -- --  03/18/24 1805 -- -- -- -- -- 99 % -- --  03/18/24 1800 -- -- -- -- -- 98 % -- --  03/18/24 1755 -- -- -- -- -- 98 % -- --  03/18/24 1750 -- -- -- -- -- 98 % -- --  03/18/24 1745 -- -- -- -- -- 98 % -- --  03/18/24 1740 -- -- -- -- -- 98 % -- --  03/18/24 1735 -- -- -- -- -- 98 % -- --  03/18/24 1730 -- -- -- -- -- 96 % -- --   03/18/24 1725 121/75 -- -- (!) 102 -- 93 % -- --  03/18/24 1720 -- -- -- -- -- 95 % -- --  03/18/24 1715 -- -- -- -- -- 96 % -- --  03/18/24 1710 -- -- -- -- -- 96 % -- --  03/18/24 1708 119/80 98.5 F (36.9 C) Oral 96 20 -- 4\' 11"  (1.499 m) 113.9 kg    Constitutional: Well-developed, obese female in no acute distress, facial edema present  Cardiovascular: normal rate & rhythm, warm and well-perfused Respiratory: normal effort, no problems with respiration noted GI: Abd soft, non-tender, no ctx palpable on exam MS: Lower extremities are tender to touch with 3 + pitting edema B/L, No warmth to touch, negative clonus, full ROM. Neurologic: Alert and oriented x 4.  GU: no CVA tenderness B/L Pelvic:  Chaperoned by RN Mickeal Aland Closed/ Long / Posterior Swabs sent for wet prep and cx's.   Dilation: Closed Effacement (%): Thick Station: Ballotable Exam by:: Debbe Fail, NP  Fetal Tracing: @ [phone] Cat 2  Baseline: 155-160 Variability:moderate  Accelerations: present Decelerations: occasional  variables noted  Toco: occasional ctx   BPP ordered 8/8   Fetal Tracing: @ 2000 Cat 1, reactive Baseline: 155 Variability:moderate  Accelerations: present Decelerations: absent Toco: occasional ctx   Labs: Results for orders placed or performed during the hospital encounter of 03/18/24 (from the past 24 hours)  Urinalysis, Routine w reflex microscopic -Urine, Clean Catch     Status: Abnormal   Collection Time: 03/18/24  5:21 PM  Result Value Ref Range   Color, Urine YELLOW YELLOW   APPearance HAZY (A) CLEAR   Specific Gravity, Urine 1.006 1.005 - 1.030   pH 6.0 5.0 - 8.0   Glucose, UA 50 (A) NEGATIVE mg/dL   Hgb urine dipstick NEGATIVE NEGATIVE   Bilirubin Urine NEGATIVE NEGATIVE   Ketones, ur NEGATIVE NEGATIVE mg/dL   Protein, ur NEGATIVE NEGATIVE mg/dL   Nitrite NEGATIVE NEGATIVE   Leukocytes,Ua NEGATIVE NEGATIVE  Wet prep, genital     Status: None   Collection Time:  03/18/24  6:17 PM  Result Value Ref Range   Yeast Wet Prep  HPF POC NONE SEEN NONE SEEN   Trich, Wet Prep NONE SEEN NONE SEEN   Clue Cells Wet Prep HPF POC NONE SEEN NONE SEEN   WBC, Wet Prep HPF POC <10 <10   Sperm NONE SEEN   CBC     Status: Abnormal   Collection Time: 03/18/24  6:37 PM  Result Value Ref Range   WBC 14.3 (H) 4.0 - 10.5 K/uL   RBC 4.10 3.87 - 5.11 MIL/uL   Hemoglobin 11.6 (L) 12.0 - 15.0 g/dL   HCT 16.1 (L) 09.6 - 04.5 %   MCV 84.9 80.0 - 100.0 fL   MCH 28.3 26.0 - 34.0 pg   MCHC 33.3 30.0 - 36.0 g/dL   RDW 40.9 81.1 - 91.4 %   Platelets 176 150 - 400 K/uL   nRBC 0.0 0.0 - 0.2 %  Comprehensive metabolic panel     Status: Abnormal   Collection Time: 03/18/24  6:37 PM  Result Value Ref Range   Sodium 136 135 - 145 mmol/L   Potassium 3.7 3.5 - 5.1 mmol/L   Chloride 107 98 - 111 mmol/L   CO2 20 (L) 22 - 32 mmol/L   Glucose, Bld 99 70 - 99 mg/dL   BUN 8 6 - 20 mg/dL   Creatinine, Ser 7.82 0.44 - 1.00 mg/dL   Calcium 8.6 (L) 8.9 - 10.3 mg/dL   Total Protein 5.7 (L) 6.5 - 8.1 g/dL   Albumin 2.2 (L) 3.5 - 5.0 g/dL   AST 14 (L) 15 - 41 U/L   ALT 11 0 - 44 U/L   Alkaline Phosphatase 147 (H) 38 - 126 U/L   Total Bilirubin 0.3 0.0 - 1.2 mg/dL   GFR, Estimated >95 >62 mL/min   Anion gap 9 5 - 15    Imaging:  No results found.  MDM & MAU COURSE  MDM:  HIGH  - R/O Labor ( L/C/P) on cervical check - Lower extremity edema and facial edema ( Will obtain baseline PreE labs) BP's normotensive - NST for 36.6 GA  - BPP ordered ( 8/8) verbal report received  - Plan for discharge   MAU Course: Orders Placed This Encounter  Procedures   Wet prep, genital   US  MFM FETAL BPP WO NON STRESS   Urinalysis, Routine w reflex microscopic -Urine, Clean Catch   CBC   Comprehensive metabolic panel   Protein / creatinine ratio, urine   Discharge patient Discharge disposition: 01-Home or Self Care; Discharge patient date: 03/18/2024     I have reviewed the patient chart  and performed the physical exam . I have ordered & interpreted the lab results and reviewed and interpreted the NST and BPP images  Medications ordered as stated below.  A/P as described below.  Counseling and education provided and patient agreeable  with plan as described below. Verbalized understanding.    ASSESSMENT   1. Swelling of lower extremity   2. Vaginal discomfort   3. False labor before 37 completed weeks of gestation in third trimester     PLAN  Discharge home in stable condition with return precautions.   Patient has follow up appointment on Monday 03/23/24  See AVS for full description of information given to the patient including both verbal and written. Patient verbalized understanding and agrees with the plan as described above.      Allergies as of 03/18/2024       Reactions   Prednisone Swelling   Mouth and face  Medication List     STOP taking these medications    azithromycin 250 MG tablet Commonly known as: ZITHROMAX       TAKE these medications    cyanocobalamin 500 MCG tablet Commonly known as: VITAMIN B12 Take 500 mcg by mouth daily.   levothyroxine  50 MCG tablet Commonly known as: SYNTHROID  Take 50 mcg by mouth daily before breakfast.   multivitamin-prenatal 27-0.8 MG Tabs tablet Take 1 tablet by mouth daily at 12 noon.   omeprazole 10 MG capsule Commonly known as: PRILOSEC Take 10 mg by mouth daily.   prenatal multivitamin Tabs tablet Take 1 tablet by mouth daily at 12 noon.   sertraline  50 MG tablet Commonly known as: ZOLOFT  Take 50 mg by mouth in the morning and at bedtime.   sertraline  100 MG tablet Commonly known as: ZOLOFT  TAKE ONE AND ONE-HALF TABLETS BY MOUTH EVERY DAY FOR MOOD        Debbe Fail, MSN, [facility], [facility] [employer]

## 2024-03-19 DIAGNOSIS — Z98891 History of uterine scar from previous surgery: Secondary | ICD-10-CM

## 2024-03-19 LAB — GC/CHLAMYDIA PROBE AMP (~~LOC~~) NOT AT ARMC
Chlamydia: NEGATIVE
Comment: NEGATIVE
Comment: NORMAL
Neisseria Gonorrhea: NEGATIVE

## 2024-03-26 ENCOUNTER — Other Ambulatory Visit: Payer: Self-pay

## 2024-03-26 ENCOUNTER — Inpatient Hospital Stay (HOSPITAL_COMMUNITY)
Admission: RE | Admit: 2024-03-26 | Discharge: 2024-03-26 | Disposition: A | Discharge status: Home / Self Care | Attending: Obstetrics and Gynecology | Admitting: Obstetrics and Gynecology

## 2024-03-26 ENCOUNTER — Encounter (HOSPITAL_COMMUNITY): Payer: Self-pay | Admitting: Obstetrics and Gynecology

## 2024-03-26 DIAGNOSIS — O26893 Other specified pregnancy related conditions, third trimester: Secondary | ICD-10-CM

## 2024-03-26 DIAGNOSIS — Z3A38 38 weeks gestation of pregnancy: Secondary | ICD-10-CM

## 2024-03-26 DIAGNOSIS — O34212 Maternal care for vertical scar from previous cesarean delivery: Secondary | ICD-10-CM | POA: Diagnosis not present

## 2024-03-26 DIAGNOSIS — R519 Headache, unspecified: Secondary | ICD-10-CM | POA: Diagnosis not present

## 2024-03-26 DIAGNOSIS — R110 Nausea: Secondary | ICD-10-CM

## 2024-03-26 DIAGNOSIS — Z3493 Encounter for supervision of normal pregnancy, unspecified, third trimester: Secondary | ICD-10-CM

## 2024-03-26 DIAGNOSIS — O368130 Decreased fetal movements, third trimester, not applicable or unspecified: Secondary | ICD-10-CM | POA: Diagnosis present

## 2024-03-26 DIAGNOSIS — R030 Elevated blood-pressure reading, without diagnosis of hypertension: Secondary | ICD-10-CM | POA: Diagnosis present

## 2024-03-26 DIAGNOSIS — Z3689 Encounter for other specified antenatal screening: Secondary | ICD-10-CM

## 2024-03-26 DIAGNOSIS — N858 Other specified noninflammatory disorders of uterus: Secondary | ICD-10-CM | POA: Diagnosis not present

## 2024-03-26 LAB — CBC WITH DIFFERENTIAL/PLATELET
Abs Immature Granulocytes: 0.12 10*3/uL — ABNORMAL HIGH (ref 0.00–0.07)
Basophils Absolute: 0.1 10*3/uL (ref 0.0–0.1)
Basophils Relative: 0 %
Eosinophils Absolute: 0.1 10*3/uL (ref 0.0–0.5)
Eosinophils Relative: 1 %
HCT: 34.8 % — ABNORMAL LOW (ref 36.0–46.0)
Hemoglobin: 11.5 g/dL — ABNORMAL LOW (ref 12.0–15.0)
Immature Granulocytes: 1 %
Lymphocytes Relative: 14 %
Lymphs Abs: 2 10*3/uL (ref 0.7–4.0)
MCH: 28.2 pg (ref 26.0–34.0)
MCHC: 33 g/dL (ref 30.0–36.0)
MCV: 85.3 fL (ref 80.0–100.0)
Monocytes Absolute: 0.6 10*3/uL (ref 0.1–1.0)
Monocytes Relative: 4 %
Neutro Abs: 11.2 10*3/uL — ABNORMAL HIGH (ref 1.7–7.7)
Neutrophils Relative %: 80 %
Platelets: 184 10*3/uL (ref 150–400)
RBC: 4.08 MIL/uL (ref 3.87–5.11)
RDW: 13.7 % (ref 11.5–15.5)
WBC: 14.1 10*3/uL — ABNORMAL HIGH (ref 4.0–10.5)
nRBC: 0 % (ref 0.0–0.2)

## 2024-03-26 LAB — COMPREHENSIVE METABOLIC PANEL WITH GFR
ALT: 12 U/L (ref 0–44)
AST: 17 U/L (ref 15–41)
Albumin: 2.2 g/dL — ABNORMAL LOW (ref 3.5–5.0)
Alkaline Phosphatase: 164 U/L — ABNORMAL HIGH (ref 38–126)
Anion gap: 9 (ref 5–15)
BUN: 7 mg/dL (ref 6–20)
CO2: 19 mmol/L — ABNORMAL LOW (ref 22–32)
Calcium: 8.6 mg/dL — ABNORMAL LOW (ref 8.9–10.3)
Chloride: 107 mmol/L (ref 98–111)
Creatinine, Ser: 0.57 mg/dL (ref 0.44–1.00)
GFR, Estimated: >60 mL/min (ref >60)
Glucose, Bld: 81 mg/dL (ref 70–99)
Potassium: 3.9 mmol/L (ref 3.5–5.1)
Sodium: 135 mmol/L (ref 135–145)
Total Bilirubin: 0.4 mg/dL (ref 0.0–1.2)
Total Protein: 5.8 g/dL — ABNORMAL LOW (ref 6.5–8.1)

## 2024-03-26 LAB — PROTEIN / CREATININE RATIO, URINE
Creatinine, Urine: 18 mg/dL
Total Protein, Urine: <6 mg/dL

## 2024-03-26 MED ORDER — ACETAMINOPHEN 500 MG PO TABS
1000.0000 mg | ORAL_TABLET | Freq: Once | ORAL | Status: AC
  Administered 2024-03-26: 1000 mg via ORAL
  Filled 2024-03-26: qty 2

## 2024-03-26 MED ORDER — ONDANSETRON HCL 4 MG PO TABS: 4.0000 mg | ORAL_TABLET | Freq: Three times a day (TID) | ORAL | 0 refills | Status: AC | PRN

## 2024-03-26 MED ORDER — ONDANSETRON 4 MG PO TBDP
8.0000 mg | ORAL_TABLET | Freq: Once | ORAL | Status: AC
Start: 2024-03-26 — End: 2024-03-26
  Administered 2024-03-26: 8 mg via ORAL
  Filled 2024-03-26: qty 2

## 2024-03-26 NOTE — MAU Note (Signed)
 Kim Park is a 33 y.o. at [redacted]w[redacted]d here in MAU reporting: she's had decreased FM today, states she's only had 2 movements today and felt hiccups. Also c/o elevated BP's taken @ home, BP's 140/110 and 150/90.  Reports current H/A, denies epigastric pain and visual disturbances.  Reports didn't meds to treat HA, unsure what to take.  Denies VB or LOF.  LMP: NA Onset of complaint: today Pain score: 4 Vitals:   03/26/24 1420  BP: 123/86  Pulse: (!) 107  Resp: 19  Temp: 98.2 F (36.8 C)  SpO2: 100%     FHT: deferred secondary maternal apparel  Lab orders placed from triage: None

## 2024-03-26 NOTE — MAU Provider Note (Signed)
 Chief Complaint:  BP Evaluation and Decreased Fetal Movement   HPI   None     Kim Park is a 33 y.o. [id number] at [redacted]w[redacted]d who presents to maternity admissions reporting headache that started last night then took blood pressures were elevated. She reports nausea that started about an hour ago and emesis x2. Denies major nausea after first trimester of pregnancy. 39 year old son had one episode of emesis 3 days ago and none since but otherwise no sick contacts. Denies any concerning food intake. Has not tried anything for the headache. Chronic DFM, endorses good movement now. Elevated blood pressure with prior preganncy but no preE. Scheduled CS in 1 week.   Pregnancy Course: [facility]: hx of Csx2, desires repeat, chronically decreased fetal movement in EMR, followed by MFM for FGR  Past Medical History:  Diagnosis Date   Family history of adverse reaction to anesthesia    mom has woken up and red heads in family   GERD (gastroesophageal reflux disease)    Headache    Hypothyroidism    PCOS (polycystic ovarian syndrome)    Post traumatic stress disorder (PTSD)    UTI (urinary tract infection)    Vaginal Pap smear, abnormal    age 51, ok since   OB History  Gravida Para Term Preterm AB Living  3 2 2   2   SAB IAB Ectopic Multiple Live Births     0 2    # Outcome Date GA Lbr Len/2nd Weight Sex Type Anes PTL Lv  3 Current           2 Term 01/09/20 [redacted]w[redacted]d  3680 g M CS-LVertical EPI  LIV  1 Term 08/22/17   3062 g M CS-LTranv EPI N LIV   Past Surgical History:  Procedure Laterality Date   APPENDECTOMY     CESAREAN SECTION     CESAREAN SECTION N/A 01/09/2020   Procedure: CESAREAN SECTION;  Surgeon: Cyd Dowse, MD;  Location: [facility];  Service: Obstetrics;  Laterality: N/A;   EYE SURGERY     TONSILLECTOMY     Family History  Problem Relation Age of Onset   Ovarian cancer Mother    Thyroid cancer Mother    Breast cancer Mother    Diverticulitis Mother    Stroke  Father    Hypertension Father    Diabetes Father    Alcoholism Father    Social History   Tobacco Use   Smoking status: Never   Smokeless tobacco: Never  Vaping Use   Vaping status: Never Used  Substance Use Topics   Alcohol use: Not Currently   Drug use: Never   Allergies  Allergen Reactions   Prednisone Swelling    Mouth and face   No medications prior to admission.    I have reviewed patient's Past Medical Hx, Surgical Hx, Family Hx, Social Hx, medications and allergies.   ROS  Pertinent items noted in HPI and remainder of comprehensive ROS otherwise negative.   PHYSICAL EXAM  Patient Vitals for the past 24 hrs:  BP Temp Temp src Pulse Resp SpO2 Height Weight  03/26/24 1646 (!) 134/91 -- -- 92 -- -- -- --  03/26/24 1645 -- -- -- -- -- 98 % -- --  03/26/24 1640 -- -- -- -- -- 98 % -- --  03/26/24 1635 -- -- -- -- -- 98 % -- --  03/26/24 1631 131/79 -- -- 85 -- -- -- --  03/26/24 1630 -- -- -- -- --  99 % -- --  03/26/24 1625 -- -- -- -- -- 99 % -- --  03/26/24 1620 -- -- -- -- -- 98 % -- --  03/26/24 1616 132/79 -- -- 82 -- -- -- --  03/26/24 1615 -- -- -- -- -- 98 % -- --  03/26/24 1610 -- -- -- -- -- 98 % -- --  03/26/24 1605 -- -- -- -- -- 98 % -- --  03/26/24 1600 129/79 -- -- 88 -- 97 % -- --  03/26/24 1555 -- -- -- -- -- 97 % -- --  03/26/24 1550 -- -- -- -- -- 97 % -- --  03/26/24 1546 126/81 -- -- 86 -- -- -- --  03/26/24 1545 -- -- -- -- -- 97 % -- --  03/26/24 1540 -- -- -- -- -- 97 % -- --  03/26/24 1535 -- -- -- -- -- 96 % -- --  03/26/24 1534 127/81 -- -- 87 -- -- -- --  03/26/24 1530 -- -- -- -- -- 97 % -- --  03/26/24 1525 -- -- -- -- -- 96 % -- --  03/26/24 1520 -- -- -- -- -- 97 % -- --  03/26/24 1515 -- -- -- -- -- 97 % -- --  03/26/24 1510 -- -- -- -- -- 97 % -- --  03/26/24 1505 -- -- -- -- -- 97 % -- --  03/26/24 1500 -- -- -- -- -- 97 % -- --  03/26/24 1455 -- -- -- -- -- 97 % -- --  03/26/24 1450 -- -- -- -- -- 98 % -- --  03/26/24  1447 129/83 -- -- 96 -- -- -- --  03/26/24 1420 123/86 98.2 F (36.8 C) Oral (!) 107 19 100 % -- --  03/26/24 1414 -- -- -- -- -- -- 4\' 11"  (1.499 m) 114.8 kg    Physical Exam Vitals and nursing note reviewed.  Constitutional:      General: She is not in acute distress.    Appearance: Normal appearance. She is not ill-appearing, toxic-appearing or diaphoretic.  Cardiovascular:     Rate and Rhythm: Normal rate.  Pulmonary:     Effort: Pulmonary effort is normal.  Musculoskeletal:        General: Normal range of motion.     Right lower leg: 1+ Pitting Edema present.     Left lower leg: 1+ Pitting Edema present.  Skin:    General: Skin is warm and dry.     Capillary Refill: Capillary refill takes less than 2 seconds.  Neurological:     General: No focal deficit present.     Mental Status: She is alert and oriented to person, place, and time.     Deep Tendon Reflexes: Reflexes are normal and symmetric.     Reflex Scores:      Patellar reflexes are 2+ on the right side and 2+ on the left side.    Comments: Negative clonus  Psychiatric:        Mood and Affect: Mood normal.        Behavior: Behavior normal.        Thought Content: Thought content normal.        Judgment: Judgment normal.         Fetal Tracing: Baseline: 145 Variability: moderate Accelerations: 15x15 Decelerations: variable Toco: irregular with UI   Labs: Results for orders placed or performed during the hospital encounter of 03/26/24 (from the past 24 hours)  Protein /  creatinine ratio, urine     Status: None   Collection Time: 03/26/24  2:50 PM  Result Value Ref Range   Creatinine, Urine 18 mg/dL   Total Protein, Urine <6 mg/dL   Protein Creatinine Ratio        0.00 - 0.15 mg/mg[Cre]  CBC with Differential/Platelet     Status: Abnormal   Collection Time: 03/26/24  2:56 PM  Result Value Ref Range   WBC 14.1 (H) 4.0 - 10.5 K/uL   RBC 4.08 3.87 - 5.11 MIL/uL   Hemoglobin 11.5 (L) 12.0 - 15.0 g/dL    HCT 16.1 (L) 09.6 - 46.0 %   MCV 85.3 80.0 - 100.0 fL   MCH 28.2 26.0 - 34.0 pg   MCHC 33.0 30.0 - 36.0 g/dL   RDW 04.5 40.9 - 81.1 %   Platelets 184 150 - 400 K/uL   nRBC 0.0 0.0 - 0.2 %   Neutrophils Relative % 80 %   Neutro Abs 11.2 (H) 1.7 - 7.7 K/uL   Lymphocytes Relative 14 %   Lymphs Abs 2.0 0.7 - 4.0 K/uL   Monocytes Relative 4 %   Monocytes Absolute 0.6 0.1 - 1.0 K/uL   Eosinophils Relative 1 %   Eosinophils Absolute 0.1 0.0 - 0.5 K/uL   Basophils Relative 0 %   Basophils Absolute 0.1 0.0 - 0.1 K/uL   Immature Granulocytes 1 %   Abs Immature Granulocytes 0.12 (H) 0.00 - 0.07 K/uL  Comprehensive metabolic panel     Status: Abnormal   Collection Time: 03/26/24  2:56 PM  Result Value Ref Range   Sodium 135 135 - 145 mmol/L   Potassium 3.9 3.5 - 5.1 mmol/L   Chloride 107 98 - 111 mmol/L   CO2 19 (L) 22 - 32 mmol/L   Glucose, Bld 81 70 - 99 mg/dL   BUN 7 6 - 20 mg/dL   Creatinine, Ser 9.14 0.44 - 1.00 mg/dL   Calcium 8.6 (L) 8.9 - 10.3 mg/dL   Total Protein 5.8 (L) 6.5 - 8.1 g/dL   Albumin 2.2 (L) 3.5 - 5.0 g/dL   AST 17 15 - 41 U/L   ALT 12 0 - 44 U/L   Alkaline Phosphatase 164 (H) 38 - 126 U/L   Total Bilirubin 0.4 0.0 - 1.2 mg/dL   GFR, Estimated >78 >29 mL/min   Anion gap 9 5 - 15    Imaging:  No results found.  MDM & MAU COURSE  MDM: Trial 8mg  zofran  for nausea and 1g tylenol  for HA: both effective FHRT with clicker for movement CBC, CMP, PC ratio to rule out PreE  MAU Course: Orders Placed This Encounter  Procedures   CBC with Differential/Platelet   Comprehensive metabolic panel   Protein / creatinine ratio, urine   Discharge patient Discharge disposition: 01-Home or Self Care; Discharge patient date: 03/26/2024   Meds ordered this encounter  Medications   acetaminophen  (TYLENOL ) tablet 1,000 mg   ondansetron  (ZOFRAN -ODT) disintegrating tablet 8 mg   ondansetron  (ZOFRAN ) 4 MG tablet    Sig: Take 1 tablet (4 mg total) by mouth every 8 (eight)  hours as needed for nausea or vomiting.    Dispense:  20 tablet    Refill:  0    Supervising Provider:   DUNN, KATHLEEN T [[id number]]    ASSESSMENT   1. Acute nonintractable headache, unspecified headache type   2. [redacted] weeks gestation of pregnancy   3. Nausea   4. Movement of fetus  present during pregnancy in third trimester   5. NST (non-stress test) reactive   - Labs to rule out Pre-E reassuring. - Relief of HA pain with Tylenol  - Relief of nausea with 8mg  zofran  - Fetal movement frequent and vigorous per patient after arrival to MAU.    PLAN  - Discharge home in stable condition. Strict precautions advised.  - Given relief of nausea with zofran  here, prescription for 4mg  zofran  every 8 hours sent to preferred pharmacy. Safe medications in pregnancy list also provided.  - Has good follow up with [facility]    Follow-up Information     [facility] Follow up on 03/31/2024.   Contact information: [address]  [address] [place] [address] [phone]                 Allergies as of 03/26/2024       Reactions   Prednisone Swelling   Mouth and face        Medication List     TAKE these medications    cyanocobalamin 500 MCG tablet Commonly known as: VITAMIN B12 Take 500 mcg by mouth daily.   levothyroxine  50 MCG tablet Commonly known as: SYNTHROID  Take 50 mcg by mouth daily before breakfast.   multivitamin-prenatal 27-0.8 MG Tabs tablet Take 1 tablet by mouth daily at 12 noon.   omeprazole 10 MG capsule Commonly known as: PRILOSEC Take 10 mg by mouth daily.   ondansetron  4 MG tablet Commonly known as: Zofran  Take 1 tablet (4 mg total) by mouth every 8 (eight) hours as needed for nausea or vomiting.   prenatal multivitamin Tabs tablet Take 1 tablet by mouth daily at 12 noon.   sertraline  50 MG tablet Commonly known as: ZOLOFT  Take 50 mg by mouth in the morning and at bedtime.   sertraline  100 MG tablet Commonly known as:  ZOLOFT  TAKE ONE AND ONE-HALF TABLETS BY MOUTH EVERY DAY FOR MOOD        Raford Bunk, MSN, CNM 03/26/2024 5:56 PM  Certified Nurse Midwife, [facility]

## 2024-03-26 NOTE — Discharge Instructions (Signed)
 Kim Park,   It was a pleasure meeting you in MAU today. Your labs were reassuring and your BP readings were normal. I recommend using magnesium  glycinate over the counter to help with leg cramps, headaches and sleep. You can take magnesium  at bedtime to help promote rest. I've also included medications safe in pregnancy. You can take 1000mg  Tylenol  every 6 hours as needed for headache pain. I'm also sending zofran  to your pharmacy for nausea.  Early congratulations on your new baby. Please come back with any warning signs of Pre-eclampsia as discussed.   Thank you for trusting us  to care for you, Abraham Hoffmann, Midwife   Safe Medications in Pregnancy   Acne:  Benzoyl Peroxide  Salicylic Acid   Backache/Headache:  Tylenol : 2 regular strength every 4 hours OR               2 Extra strength every 6 hours   Colds/Coughs/Allergies:  Benadryl  (alcohol free) 25 mg every 6 hours as needed  Breath right strips  Claritin  Cepacol throat lozenges  Chloraseptic throat spray  Cold-Eeze- up to three times per day  Cough drops, alcohol free  Flonase (by prescription only)  Guaifenesin   Mucinex   Robitussin DM (plain only, alcohol free)  Saline nasal spray/drops  Sudafed (pseudoephedrine) & Actifed * use only after [redacted] weeks gestation and if you do not have high blood pressure  Tylenol   Vicks Vaporub  Zinc lozenges  Zyrtec   Constipation:  Colace  Ducolax suppositories  Fleet enema  Glycerin  suppositories  Metamucil  Milk of magnesia  Miralax  Senokot  Smooth move tea   Diarrhea:  Kaopectate  Imodium A-D   *NO pepto Bismol   Hemorrhoids:  Anusol   Anusol  HC  Preparation H  Tucks   Indigestion:  Tums  Maalox  Mylanta  Zantac  Pepcid   Insomnia:  Benadryl  (alcohol free) 25mg  every 6 hours as needed  Tylenol  PM  Unisom, no Gelcaps   Leg Cramps:  Tums  MagGel   Nausea/Vomiting:  Bonine  Dramamine  Emetrol  Ginger extract  Sea bands  Meclizine  Nausea  medication to take during pregnancy:  Unisom (doxylamine succinate 25 mg tablets) Take one tablet daily at bedtime. If symptoms are not adequately controlled, the dose can be increased to a maximum recommended dose of two tablets daily (1/2 tablet in the morning, 1/2 tablet mid-afternoon and one at bedtime).  Vitamin B6 100mg  tablets. Take one tablet twice a day (up to 200 mg per day).   Skin Rashes:  Aveeno products  Benadryl  cream or 25mg  every 6 hours as needed  Calamine Lotion  1% cortisone cream   Yeast infection:  Gyne-lotrimin 7  Monistat 7    **If taking multiple medications, please check labels to avoid duplicating the same active ingredients  **take medication as directed on the label  ** Do not exceed 4000 mg of tylenol  in 24 hours  **Do not take medications that contain aspirin or ibuprofen 

## 2024-03-31 ENCOUNTER — Encounter (HOSPITAL_COMMUNITY)
Admission: RE | Admit: 2024-03-31 | Discharge: 2024-03-31 | Disposition: A | Discharge status: Home / Self Care | Source: Ambulatory Visit | Attending: Student | Admitting: Student

## 2024-03-31 DIAGNOSIS — O34219 Maternal care for unspecified type scar from previous cesarean delivery: Secondary | ICD-10-CM

## 2024-03-31 DIAGNOSIS — Z01812 Encounter for preprocedural laboratory examination: Secondary | ICD-10-CM | POA: Insufficient documentation

## 2024-03-31 HISTORY — DX: Family history of other specified conditions: Z84.89

## 2024-03-31 LAB — CBC
HCT: 35.7 % — ABNORMAL LOW (ref 36.0–46.0)
Hemoglobin: 11.7 g/dL — ABNORMAL LOW (ref 12.0–15.0)
MCH: 27.7 pg (ref 26.0–34.0)
MCHC: 32.8 g/dL (ref 30.0–36.0)
MCV: 84.6 fL (ref 80.0–100.0)
Platelets: 173 10*3/uL (ref 150–400)
RBC: 4.22 MIL/uL (ref 3.87–5.11)
RDW: 13.8 % (ref 11.5–15.5)
WBC: 11.2 10*3/uL — ABNORMAL HIGH (ref 4.0–10.5)
nRBC: 0 % (ref 0.0–0.2)

## 2024-03-31 LAB — RPR: RPR Ser Ql: NONREACTIVE

## 2024-03-31 LAB — TYPE AND SCREEN
ABO/RH(D): O POS
Antibody Screen: NEGATIVE

## 2024-04-01 NOTE — Anesthesia Preprocedure Evaluation (Signed)
 Anesthesia Evaluation  Patient identified by MRN, date of birth, ID band Patient awake    Reviewed: Allergy & Precautions, NPO status , Patient's Chart, lab work & pertinent test results  Airway Mallampati: II  TM Distance: >3 FB Neck ROM: Full    Dental no notable dental hx. (+) Teeth Intact, Dental Advisory Given   Pulmonary    Pulmonary exam normal breath sounds clear to auscultation       Cardiovascular Exercise Tolerance: Good Normal cardiovascular exam Rhythm:Regular Rate:Normal     Neuro/Psych  Headaches PSYCHIATRIC DISORDERS Anxiety        GI/Hepatic Neg liver ROS,,,  Endo/Other  negative endocrine ROSHypothyroidism  Class 3 obesity  Renal/GU negative Renal ROS     Musculoskeletal   Abdominal  (+) + obese  Peds  Hematology Lab Results      Component                Value               Date                      WBC                      11.2 (H)            03/31/2024                HGB                      11.7 (L)            03/31/2024                HCT                      35.7 (L)            03/31/2024                MCV                      84.6                03/31/2024                PLT                      173                 03/31/2024   T & S available             Anesthesia Other Findings   Reproductive/Obstetrics (+) Pregnancy                             Anesthesia Physical Anesthesia Plan  ASA: 3  Anesthesia Plan: Spinal   Post-op Pain Management: Tylenol  PO (pre-op)* and Toradol  IV (intra-op)*   Induction:   PONV Risk Score and Plan: 3 and Treatment may vary due to age or medical condition and Ondansetron   Airway Management Planned: Natural Airway and Nasal Cannula  Additional Equipment: None  Intra-op Plan:   Post-operative Plan:   Informed Consent: I have reviewed the patients History and Physical, chart, labs and discussed the procedure including  the risks, benefits and alternatives for the proposed anesthesia with the patient or authorized  representative who has indicated his/her understanding and acceptance.     Dental advisory given  Plan Discussed with: CRNA and Surgeon  Anesthesia Plan Comments: (39.1 Wk G3P2 w BMI 51.1 for repeat csection x3 and Tubal ligation)       Anesthesia Quick Evaluation

## 2024-04-01 NOTE — H&P (Signed)
 Kim Park is a 33 y.o. female presenting for repeat cesarean section at [redacted]w[redacted]d.  Pregnancy is complicated by:  - Previous c-section x 2 - Fetal history of CHD: fetal echo normal  - Maternal obesity: pre-pregnancy BMI 47 - Hypothyroidisim: 50 mcg levothyroxine , stable during pregnancy  - Umbilical cord cyst - PTSD and bipolar disorder: Sertraline  100 mg - Fetal growth restriction: AC<1% at 32 wks, normal biometry at MFM. Delivery at 39 wks recommended  +FM. Denies LOF or VB OB History     Gravida  3   Para  2   Term  2   Preterm      AB      Living  2      SAB      IAB      Ectopic      Multiple  0   Live Births  2          Past Medical History:  Diagnosis Date   Family history of adverse reaction to anesthesia    mom has woken up and red heads in family   GERD (gastroesophageal reflux disease)    Headache    Hypothyroidism    PCOS (polycystic ovarian syndrome)    Post traumatic stress disorder (PTSD)    UTI (urinary tract infection)    Vaginal Pap smear, abnormal    age 60, ok since   Past Surgical History:  Procedure Laterality Date   APPENDECTOMY     CESAREAN SECTION     CESAREAN SECTION N/A 01/09/2020   Procedure: CESAREAN SECTION;  Surgeon: Cyd Dowse, MD;  Location: [facility];  Service: Obstetrics;  Laterality: N/A;   EYE SURGERY     TONSILLECTOMY     Family History: family history includes Alcoholism in her father; Breast cancer in her mother; Diabetes in her father; Diverticulitis in her mother; Hypertension in her father; Ovarian cancer in her mother; Stroke in her father; Thyroid cancer in her mother. Social History:  reports that she has never smoked. She has never used smokeless tobacco. She reports that she does not currently use alcohol. She reports that she does not use drugs.     Maternal Diabetes: No Genetic Screening: Normal Maternal Ultrasounds/Referrals: Other:umbilical cord cyst Fetal Ultrasounds or other  Referrals:  Referred to Materal Fetal Medicine  Maternal Substance Abuse:  No Significant Maternal Medications:  Meds include: Other: levothyroxine , pantoprazole,  sertraline  Significant Maternal Lab Results:  Group B Strep positive Number of Prenatal Visits:greater than 3 verified prenatal visits Maternal Vaccinations:TDap Other Comments:  None  Review of Systems  Constitutional:  Negative for chills and fever.  Respiratory:  Negative for chest tightness and shortness of breath.   Cardiovascular:  Negative for chest pain.  Gastrointestinal:  Positive for abdominal pain. Negative for constipation.  Genitourinary:  Positive for pelvic pain. Negative for vaginal bleeding and vaginal discharge.  Musculoskeletal:  Negative for gait problem.  Neurological:  Negative for light-headedness and headaches.   History   There were no vitals taken for this visit. Exam Physical Exam Constitutional:      General: She is not in acute distress.    Appearance: She is obese.  HENT:     Head: Normocephalic and atraumatic.  Pulmonary:     Effort: Pulmonary effort is normal.  Musculoskeletal:        General: Normal range of motion.  Skin:    General: Skin is warm and dry.  Neurological:     Mental Status: She  is alert.  Psychiatric:        Mood and Affect: Mood normal.        Behavior: Behavior normal.     Prenatal labs: ABO, Rh: --/--/O POS (05/06 4098) Antibody: NEG (05/06 0926) Rubella: Nonimmune (10/15 0000) RPR: NON REACTIVE (05/06 0926)  HBsAg: Negative (10/15 0000)  HIV: Non-reactive (10/15 0000)  GBS:  Positive   Assessment/Plan: 33 yo G3P3 presenting for RLTCS and BS - Admit - ERAS  - Verify consent - To OR when ready   [name] 04/01/2024, 12:27 AM

## 2024-04-02 ENCOUNTER — Inpatient Hospital Stay (HOSPITAL_COMMUNITY): Admitting: Anesthesiology

## 2024-04-02 ENCOUNTER — Encounter (HOSPITAL_COMMUNITY): Payer: Self-pay | Admitting: Student

## 2024-04-02 ENCOUNTER — Encounter (HOSPITAL_COMMUNITY)
Admission: RE | Disposition: A | Discharge status: Home / Self Care | Payer: Self-pay | Source: Home / Self Care | Attending: Student

## 2024-04-02 ENCOUNTER — Inpatient Hospital Stay (HOSPITAL_COMMUNITY)
Admission: RE | Admit: 2024-04-02 | Discharge: 2024-04-05 | DRG: 784 | Disposition: A | Discharge status: Home / Self Care | Payer: Self-pay | Attending: Student | Admitting: Student

## 2024-04-02 ENCOUNTER — Other Ambulatory Visit: Payer: Self-pay

## 2024-04-02 DIAGNOSIS — E039 Hypothyroidism, unspecified: Secondary | ICD-10-CM | POA: Diagnosis present

## 2024-04-02 DIAGNOSIS — O99824 Streptococcus B carrier state complicating childbirth: Secondary | ICD-10-CM | POA: Diagnosis present

## 2024-04-02 DIAGNOSIS — O99284 Endocrine, nutritional and metabolic diseases complicating childbirth: Secondary | ICD-10-CM | POA: Diagnosis present

## 2024-04-02 DIAGNOSIS — Z302 Encounter for sterilization: Secondary | ICD-10-CM

## 2024-04-02 DIAGNOSIS — E66813 Obesity, class 3: Secondary | ICD-10-CM | POA: Diagnosis present

## 2024-04-02 DIAGNOSIS — O34211 Maternal care for low transverse scar from previous cesarean delivery: Secondary | ICD-10-CM

## 2024-04-02 DIAGNOSIS — Z79899 Other long term (current) drug therapy: Secondary | ICD-10-CM | POA: Diagnosis not present

## 2024-04-02 DIAGNOSIS — Z3A39 39 weeks gestation of pregnancy: Secondary | ICD-10-CM

## 2024-04-02 DIAGNOSIS — O3429 Maternal care due to uterine scar from other previous surgery: Secondary | ICD-10-CM | POA: Diagnosis present

## 2024-04-02 DIAGNOSIS — Z833 Family history of diabetes mellitus: Secondary | ICD-10-CM | POA: Diagnosis not present

## 2024-04-02 DIAGNOSIS — O99344 Other mental disorders complicating childbirth: Secondary | ICD-10-CM | POA: Diagnosis present

## 2024-04-02 DIAGNOSIS — F319 Bipolar disorder, unspecified: Secondary | ICD-10-CM | POA: Diagnosis present

## 2024-04-02 DIAGNOSIS — O99214 Obesity complicating childbirth: Secondary | ICD-10-CM | POA: Diagnosis present

## 2024-04-02 DIAGNOSIS — Z8249 Family history of ischemic heart disease and other diseases of the circulatory system: Secondary | ICD-10-CM

## 2024-04-02 DIAGNOSIS — F4310 Post-traumatic stress disorder, unspecified: Secondary | ICD-10-CM | POA: Diagnosis present

## 2024-04-02 DIAGNOSIS — O34219 Maternal care for unspecified type scar from previous cesarean delivery: Principal | ICD-10-CM

## 2024-04-02 DIAGNOSIS — Z98891 History of uterine scar from previous surgery: Secondary | ICD-10-CM

## 2024-04-02 HISTORY — PX: SMALL BOWEL REPAIR: SHX6447

## 2024-04-02 LAB — CREATININE, SERUM
Creatinine, Ser: 0.53 mg/dL (ref 0.44–1.00)
GFR, Estimated: >60 mL/min (ref >60)

## 2024-04-02 SURGERY — Surgical Case
Anesthesia: Spinal | Site: Abdomen | Laterality: Bilateral

## 2024-04-02 MED ORDER — WITCH HAZEL-GLYCERIN EX PADS: 1.0000 | MEDICATED_PAD | CUTANEOUS | Status: DC | PRN

## 2024-04-02 MED ORDER — DIBUCAINE (PERIANAL) 1 % EX OINT: 1.0000 | TOPICAL_OINTMENT | CUTANEOUS | Status: DC | PRN

## 2024-04-02 MED ORDER — NALOXONE HCL 4 MG/10ML IJ SOLN: 1.0000 ug/kg/h | INTRAVENOUS | Status: DC | PRN

## 2024-04-02 MED ORDER — DIPHENHYDRAMINE HCL 25 MG PO CAPS: 25.0000 mg | ORAL_CAPSULE | ORAL | Status: DC | PRN

## 2024-04-02 MED ORDER — ZOLPIDEM TARTRATE 5 MG PO TABS: 5.0000 mg | ORAL_TABLET | Freq: Every evening | ORAL | Status: DC | PRN

## 2024-04-02 MED ORDER — PRENATAL MULTIVITAMIN CH
1.0000 | ORAL_TABLET | Freq: Every day | ORAL | Status: DC
  Administered 2024-04-02 – 2024-04-05 (×4): 1 via ORAL
  Filled 2024-04-02 (×4): qty 1

## 2024-04-02 MED ORDER — SCOPOLAMINE 1 MG/3DAYS TD PT72
MEDICATED_PATCH | TRANSDERMAL | Status: AC
  Filled 2024-04-02: qty 1

## 2024-04-02 MED ORDER — POVIDONE-IODINE 10 % EX SWAB: 2.0000 | Freq: Once | CUTANEOUS | Status: DC

## 2024-04-02 MED ORDER — TRANEXAMIC ACID-NACL 1000-0.7 MG/100ML-% IV SOLN
INTRAVENOUS | Status: AC
  Filled 2024-04-02: qty 100

## 2024-04-02 MED ORDER — SODIUM CHLORIDE 0.9 % IR SOLN
Status: DC | PRN
  Administered 2024-04-02: 1000 mL

## 2024-04-02 MED ORDER — PHENYLEPHRINE HCL-NACL 20-0.9 MG/250ML-% IV SOLN
INTRAVENOUS | Status: DC | PRN
  Administered 2024-04-02: 60 ug/min via INTRAVENOUS

## 2024-04-02 MED ORDER — SODIUM CHLORIDE 0.9% FLUSH: 3.0000 mL | INTRAVENOUS | Status: DC | PRN

## 2024-04-02 MED ORDER — MENTHOL 3 MG MT LOZG: 1.0000 | LOZENGE | OROMUCOSAL | Status: DC | PRN

## 2024-04-02 MED ORDER — SCOPOLAMINE 1 MG/3DAYS TD PT72
1.0000 | MEDICATED_PATCH | Freq: Once | TRANSDERMAL | Status: AC
  Administered 2024-04-02: 1.5 mg via TRANSDERMAL

## 2024-04-02 MED ORDER — DIPHENHYDRAMINE HCL 25 MG PO CAPS: 25.0000 mg | ORAL_CAPSULE | Freq: Four times a day (QID) | ORAL | Status: DC | PRN

## 2024-04-02 MED ORDER — STERILE WATER FOR IRRIGATION IR SOLN
Status: DC | PRN
  Administered 2024-04-02: 1000 mL

## 2024-04-02 MED ORDER — SENNOSIDES-DOCUSATE SODIUM 8.6-50 MG PO TABS
2.0000 | ORAL_TABLET | Freq: Every day | ORAL | Status: DC
  Administered 2024-04-03 – 2024-04-05 (×3): 2 via ORAL
  Filled 2024-04-02 (×3): qty 2

## 2024-04-02 MED ORDER — PANTOPRAZOLE SODIUM 40 MG PO TBEC
40.0000 mg | DELAYED_RELEASE_TABLET | Freq: Every day | ORAL | Status: DC
  Administered 2024-04-02 – 2024-04-05 (×5): 40 mg via ORAL
  Filled 2024-04-02 (×4): qty 1

## 2024-04-02 MED ORDER — ACETAMINOPHEN 500 MG PO TABS
1000.0000 mg | ORAL_TABLET | Freq: Four times a day (QID) | ORAL | Status: DC
  Administered 2024-04-02 – 2024-04-05 (×12): 1000 mg via ORAL
  Filled 2024-04-02 (×12): qty 2

## 2024-04-02 MED ORDER — DIPHENHYDRAMINE HCL 50 MG/ML IJ SOLN: 12.5000 mg | INTRAMUSCULAR | Status: DC | PRN

## 2024-04-02 MED ORDER — BUPIVACAINE IN DEXTROSE 0.75-8.25 % IT SOLN
INTRATHECAL | Status: DC | PRN
  Administered 2024-04-02: 12 mL via INTRATHECAL

## 2024-04-02 MED ORDER — IBUPROFEN 600 MG PO TABS
600.0000 mg | ORAL_TABLET | Freq: Four times a day (QID) | ORAL | Status: DC
  Administered 2024-04-02 – 2024-04-05 (×11): 600 mg via ORAL
  Filled 2024-04-02 (×11): qty 1

## 2024-04-02 MED ORDER — HYDROMORPHONE HCL 1 MG/ML IJ SOLN: 0.2500 mg | INTRAMUSCULAR | Status: DC | PRN

## 2024-04-02 MED ORDER — CEFAZOLIN SODIUM-DEXTROSE 2-4 GM/100ML-% IV SOLN
INTRAVENOUS | Status: AC
  Filled 2024-04-02: qty 100

## 2024-04-02 MED ORDER — ENOXAPARIN SODIUM 60 MG/0.6ML IJ SOSY
60.0000 mg | PREFILLED_SYRINGE | Freq: Two times a day (BID) | INTRAMUSCULAR | Status: DC
  Administered 2024-04-02 – 2024-04-03 (×2): 60 mg via SUBCUTANEOUS
  Filled 2024-04-02 (×2): qty 0.6

## 2024-04-02 MED ORDER — IBUPROFEN 600 MG PO TABS: 600.0000 mg | ORAL_TABLET | Freq: Four times a day (QID) | ORAL | Status: DC

## 2024-04-02 MED ORDER — KETOROLAC TROMETHAMINE 30 MG/ML IJ SOLN
30.0000 mg | Freq: Four times a day (QID) | INTRAMUSCULAR | Status: DC
  Filled 2024-04-02: qty 1

## 2024-04-02 MED ORDER — KETOROLAC TROMETHAMINE 30 MG/ML IJ SOLN
INTRAMUSCULAR | Status: AC
  Filled 2024-04-02: qty 1

## 2024-04-02 MED ORDER — LACTATED RINGERS IV SOLN: INTRAVENOUS | Status: DC | PRN

## 2024-04-02 MED ORDER — ACETAMINOPHEN 10 MG/ML IV SOLN
INTRAVENOUS | Status: DC | PRN
  Administered 2024-04-02: 1000 mg via INTRAVENOUS

## 2024-04-02 MED ORDER — OXYCODONE HCL 5 MG/5ML PO SOLN: 5.0000 mg | Freq: Once | ORAL | Status: DC | PRN

## 2024-04-02 MED ORDER — KETOROLAC TROMETHAMINE 30 MG/ML IJ SOLN
30.0000 mg | Freq: Once | INTRAMUSCULAR | Status: AC | PRN
  Administered 2024-04-02: 30 mg via INTRAVENOUS

## 2024-04-02 MED ORDER — ONDANSETRON HCL 4 MG/2ML IJ SOLN
INTRAMUSCULAR | Status: DC | PRN
  Administered 2024-04-02: 4 mg via INTRAVENOUS

## 2024-04-02 MED ORDER — OXYCODONE HCL 5 MG PO TABS: 5.0000 mg | ORAL_TABLET | Freq: Once | ORAL | Status: DC | PRN

## 2024-04-02 MED ORDER — MORPHINE SULFATE (PF) 0.5 MG/ML IJ SOLN
INTRAMUSCULAR | Status: AC
  Filled 2024-04-02: qty 10

## 2024-04-02 MED ORDER — FENTANYL CITRATE (PF) 100 MCG/2ML IJ SOLN
INTRAMUSCULAR | Status: AC
  Filled 2024-04-02: qty 2

## 2024-04-02 MED ORDER — ONDANSETRON HCL 4 MG/2ML IJ SOLN
INTRAMUSCULAR | Status: AC
  Filled 2024-04-02: qty 2

## 2024-04-02 MED ORDER — SIMETHICONE 80 MG PO CHEW: 80.0000 mg | CHEWABLE_TABLET | ORAL | Status: DC | PRN

## 2024-04-02 MED ORDER — PHENYLEPHRINE HCL (PRESSORS) 10 MG/ML IV SOLN
INTRAVENOUS | Status: DC | PRN
  Administered 2024-04-02: 80 ug via INTRAVENOUS

## 2024-04-02 MED ORDER — FENTANYL CITRATE (PF) 100 MCG/2ML IJ SOLN
INTRAMUSCULAR | Status: DC | PRN
  Administered 2024-04-02: 15 ug via INTRATHECAL

## 2024-04-02 MED ORDER — TRANEXAMIC ACID-NACL 1000-0.7 MG/100ML-% IV SOLN
INTRAVENOUS | Status: DC | PRN
  Administered 2024-04-02: 1000 mg via INTRAVENOUS

## 2024-04-02 MED ORDER — SIMETHICONE 80 MG PO CHEW
80.0000 mg | CHEWABLE_TABLET | Freq: Three times a day (TID) | ORAL | Status: DC
  Administered 2024-04-02 – 2024-04-05 (×8): 80 mg via ORAL
  Filled 2024-04-02 (×8): qty 1

## 2024-04-02 MED ORDER — ONDANSETRON HCL 4 MG/2ML IJ SOLN: 4.0000 mg | Freq: Three times a day (TID) | INTRAMUSCULAR | Status: DC | PRN

## 2024-04-02 MED ORDER — OXYCODONE HCL 5 MG PO TABS
5.0000 mg | ORAL_TABLET | ORAL | Status: DC | PRN
  Administered 2024-04-03: 5 mg via ORAL
  Administered 2024-04-04 (×3): 10 mg via ORAL
  Administered 2024-04-04: 5 mg via ORAL
  Filled 2024-04-02 (×2): qty 2
  Filled 2024-04-02: qty 1
  Filled 2024-04-02: qty 2
  Filled 2024-04-02 (×3): qty 1

## 2024-04-02 MED ORDER — METHOCARBAMOL 500 MG PO TABS
500.0000 mg | ORAL_TABLET | Freq: Three times a day (TID) | ORAL | Status: DC
  Administered 2024-04-02 – 2024-04-05 (×9): 500 mg via ORAL
  Filled 2024-04-02 (×13): qty 1

## 2024-04-02 MED ORDER — OXYTOCIN-SODIUM CHLORIDE 30-0.9 UT/500ML-% IV SOLN
INTRAVENOUS | Status: DC | PRN
  Administered 2024-04-02: 350 mL via INTRAVENOUS

## 2024-04-02 MED ORDER — SOD CITRATE-CITRIC ACID 500-334 MG/5ML PO SOLN
ORAL | Status: AC
Start: 2024-04-02 — End: ?
  Filled 2024-04-02: qty 30

## 2024-04-02 MED ORDER — ONDANSETRON HCL 4 MG/2ML IJ SOLN: 4.0000 mg | Freq: Once | INTRAMUSCULAR | Status: DC | PRN

## 2024-04-02 MED ORDER — SERTRALINE HCL 100 MG PO TABS
100.0000 mg | ORAL_TABLET | Freq: Every day | ORAL | Status: DC
  Administered 2024-04-03 – 2024-04-05 (×3): 100 mg via ORAL
  Filled 2024-04-02 (×3): qty 1

## 2024-04-02 MED ORDER — MEPERIDINE HCL 25 MG/ML IJ SOLN: 6.2500 mg | INTRAMUSCULAR | Status: DC | PRN

## 2024-04-02 MED ORDER — LEVOTHYROXINE SODIUM 50 MCG PO TABS
50.0000 ug | ORAL_TABLET | Freq: Every day | ORAL | Status: DC
  Administered 2024-04-03 – 2024-04-05 (×3): 50 ug via ORAL
  Filled 2024-04-02 (×3): qty 1

## 2024-04-02 MED ORDER — COCONUT OIL OIL: 1.0000 | TOPICAL_OIL | Status: DC | PRN

## 2024-04-02 MED ORDER — NALOXONE HCL 0.4 MG/ML IJ SOLN: 0.4000 mg | INTRAMUSCULAR | Status: DC | PRN

## 2024-04-02 MED ORDER — OXYTOCIN-SODIUM CHLORIDE 30-0.9 UT/500ML-% IV SOLN: 2.5000 [IU]/h | INTRAVENOUS | Status: AC

## 2024-04-02 MED ORDER — CEFAZOLIN SODIUM-DEXTROSE 2-4 GM/100ML-% IV SOLN
2.0000 g | INTRAVENOUS | Status: AC
  Administered 2024-04-02: 2 g via INTRAVENOUS

## 2024-04-02 MED ORDER — MORPHINE SULFATE (PF) 0.5 MG/ML IJ SOLN
INTRAMUSCULAR | Status: DC | PRN
Start: 2024-04-02 — End: 2024-04-02
  Administered 2024-04-02: 150 ug via INTRATHECAL

## 2024-04-02 MED ORDER — PHENYLEPHRINE HCL-NACL 20-0.9 MG/250ML-% IV SOLN
INTRAVENOUS | Status: AC
  Filled 2024-04-02: qty 250

## 2024-04-02 SURGICAL SUPPLY — 38 items
CATH FOLEY LF 14FR (CATHETERS) IMPLANT
CHLORAPREP W/TINT 26ML (MISCELLANEOUS) ×4 IMPLANT
CLAMP UMBILICAL CORD (MISCELLANEOUS) ×2 IMPLANT
CLOTH BEACON ORANGE TIMEOUT ST (SAFETY) ×2 IMPLANT
DERMABOND ADVANCED .7 DNX12 (GAUZE/BANDAGES/DRESSINGS) ×2 IMPLANT
DRSG OPSITE POSTOP 4X10 (GAUZE/BANDAGES/DRESSINGS) ×2 IMPLANT
ELECTRODE REM PT RTRN 9FT ADLT (ELECTROSURGICAL) ×2 IMPLANT
EXTRACTOR VACUUM BELL STYLE (SUCTIONS) IMPLANT
EXTRACTOR VACUUM KIWI (MISCELLANEOUS) IMPLANT
GAUZE SPONGE 4X4 12PLY STRL LF (GAUZE/BANDAGES/DRESSINGS) IMPLANT
GLOVE BIOGEL PI IND STRL 6.5 (GLOVE) ×2 IMPLANT
GLOVE BIOGEL PI IND STRL 7.0 (GLOVE) ×4 IMPLANT
GLOVE SURG SS PI 6.0 STRL IVOR (GLOVE) ×2 IMPLANT
GOWN STRL REUS W/TWL LRG LVL3 (GOWN DISPOSABLE) ×4 IMPLANT
KIT ABG SYR 3ML LUER SLIP (SYRINGE) IMPLANT
LIGASURE IMPACT 36 18CM CVD LR (INSTRUMENTS) IMPLANT
MAT PREVALON FULL STRYKER (MISCELLANEOUS) IMPLANT
NDL HYPO 25X5/8 SAFETYGLIDE (NEEDLE) IMPLANT
NEEDLE HYPO 25X5/8 SAFETYGLIDE (NEEDLE) IMPLANT
NS IRRIG 1000ML POUR BTL (IV SOLUTION) ×2 IMPLANT
PACK C SECTION WH (CUSTOM PROCEDURE TRAY) ×2 IMPLANT
PAD ABD 8X10 STRL (GAUZE/BANDAGES/DRESSINGS) IMPLANT
PAD OB MATERNITY 4.3X12.25 (PERSONAL CARE ITEMS) ×2 IMPLANT
PENCIL SMOKE EVAC W/HOLSTER (ELECTROSURGICAL) ×2 IMPLANT
RETAINER VISCERAL (MISCELLANEOUS) IMPLANT
RTRCTR C-SECT PINK 25CM LRG (MISCELLANEOUS) IMPLANT
SPONGE T-LAP 18X18 ~~LOC~~+RFID (SPONGE) IMPLANT
SUT PDS AB 0 CTX 36 PDP370T (SUTURE) IMPLANT
SUT PLAIN ABS 2-0 CT1 27XMFL (SUTURE) IMPLANT
SUT SILK 3 0 SH CR/8 (SUTURE) IMPLANT
SUT VIC AB 0 CT1 36 (SUTURE) ×4 IMPLANT
SUT VIC AB 0 CTX36XBRD ANBCTRL (SUTURE) ×4 IMPLANT
SUT VIC AB 2-0 CT1 TAPERPNT 27 (SUTURE) ×4 IMPLANT
SUT VIC AB 4-0 KS 27 (SUTURE) ×2 IMPLANT
SUT VICRYL+ 3-0 36IN CT-1 (SUTURE) ×2 IMPLANT
TOWEL OR 17X24 6PK STRL BLUE (TOWEL DISPOSABLE) ×2 IMPLANT
TRAY FOLEY W/BAG SLVR 14FR LF (SET/KITS/TRAYS/PACK) ×2 IMPLANT
WATER STERILE IRR 1000ML POUR (IV SOLUTION) ×2 IMPLANT

## 2024-04-02 NOTE — Transfer of Care (Signed)
 Immediate Anesthesia Transfer of Care Note  Patient: Kim Park  Procedure(s) Performed: CESAREAN SECTION, WITH BILATERAL TUBAL LIGATION (Bilateral)  Patient Location: PACU  Anesthesia Type:Spinal  Level of Consciousness: awake, alert , and oriented  Airway & Oxygen Therapy: Patient Spontanous Breathing  Post-op Assessment: Report given to RN and Post -op Vital signs reviewed and stable  Post vital signs: Reviewed and stable  Last Vitals:  Vitals Value Taken Time  BP 117/69 04/02/24 0953  Temp    Pulse 77 04/02/24 0956  Resp 20 04/02/24 0956  SpO2 98 % 04/02/24 0956  Vitals shown include unfiled device data.  Last Pain:  Vitals:   04/02/24 0600  TempSrc:   PainSc: 0-No pain         Complications: No notable events documented.

## 2024-04-02 NOTE — Lactation Note (Signed)
 This note was copied from a baby's chart. Lactation Consultation Note  Patient Name: Kim Park [id number] Date: 04/02/2024 Age:31 hours   Mom said no to [facility] services, touched base with bedside RN- encouraged her to let mom know she can call if she desires to see LC while on MBU.   Feeding Mother's Current Feeding Choice: Breast Milk and Formula  Consult Status Consult Status: Complete (mother declined follow up)    [facility] 04/02/2024, 9:21 AM

## 2024-04-02 NOTE — Lactation Note (Signed)
 This note was copied from a baby's chart. Lactation Consultation Note  Patient Name: Kim Park [id number] Date: 04/02/2024 Age:31 hours    Mom a no to [facility] in L&D. Peds provider contacted LC- Mom desired lactation assist. Checked on mom- mom was asleep. Checked back an hour later- baby just coming off breast. Mom covers up quickly with LC entrance, explained and questioned if mom desired assist. Mom explains baby did better- just finished feeding for 15 minutes, no longer worried- does not desire assist.    [name] 04/02/2024, 4:44 PM

## 2024-04-02 NOTE — Interval H&P Note (Signed)
 History and Physical Interval Note:  04/02/2024 7:22 AM  Kim Park  has presented today for surgery, with the diagnosis of repeat cesarean section and sterilization.  The various methods of treatment have been discussed with the patient and family. After consideration of risks, benefits and other options for treatment, the patient has consented to  Procedure(s) with comments: CESAREAN SECTION, WITH BILATERAL TUBAL LIGATION (Bilateral) - request OB Fellow assist as a surgical intervention.  The patient's history has been reviewed, patient examined, no change in status, stable for surgery.  I have reviewed the patient's chart and labs.  Questions were answered to the patient's satisfaction.     [name]

## 2024-04-02 NOTE — Op Note (Addendum)
 C-Section Operative Note  Date: 04/02/24  Preoperative Diagnosis: [redacted] weeks gestation Previous c-section x 2 Encounter for sterilization  Postoperative Diagnosis:  [redacted] weeks gestation Previous c-section x 2 Encounter for sterilization Small bowel serosal abrasion  Procedure: repeat low transverse cesarean section without extensions, repair of small bowel serosal injury, bilateral salpingectomy  Surgeon: Rossie Coon, DO Assist:  J. Ozan, DO B. Hildy Lowers, MD-Trauma surgery    An experienced assistant was required given the standard of surgical care given the complexity of the case and maternal body habitus.  This assistant was needed for exposure, dissection, suctioning, retraction, instrument exchange, assisting with delivery with administration of fundal pressure, and for overall help during the procedure.     Operative Findings: Omental adhesions to the posterior rectus fascia. Gravid uterus with very thin lower uterine segment. Poorly decompressed bladder. clear amniotic fluid. Viable female infant in occiput transverse position weighing 3090g with APGARS of 6 and 7 at 1 and 5 minutes, respectively. Hemostatic small bowel serosal abrasion. 2-3 cm defect inferior to hysterotomy. Normal fallopian tubes and ovaries bilaterally.  Complications: Small bowel serosal injury Specimens: Bilateral fallopian tubes EBL 83 IVF 2L UOP 250  Patient Course: Patient is a 33 yo G3P2002 at 53 weeks who presented for scheduled repeat cesarean section and sterilization.  Consent:  R/B/A of cesarean section discussed with patient. Alternative would be vaginal delivery which would mean shorter postpartum stay and decreased risk of bleeding. Risks of cesarean section include but are not limited to infection of the uterus, pelvic organs, or skin, inadvertent injury to internal organs, such as bowel or bladder, vasculature, and nerves. If there is major injury, extensive surgery may be required. If injury is  minor, it may be treated with relative ease and repaired at the time of injury. Discussed possibility of excessive blood loss and transfusion. If bleeding cannot be controlled using medical or minor surgical methods, a cesarean hysterectomy may be performed which would mean no future fertility. Patient accepts the possibility of blood transfusion, if necessary. Patient understands and agrees to move forward with section.   Operative Procedure: Patient was taken to the operating room where spinal anesthesia was found to be adequate by Allis clamp test. She was prepped and draped in the normal sterile fashion in the dorsal supine position with a leftward tilt. An appropriate time out was performed. A Pfannenstiel skin incision was then made with the scalpel and carried through to the underlying layer of fascia by sharp dissection and Bovie cautery. The fascia was nicked in the midline and the incision was extended laterally with Mayo scissors. The superior aspect of the incision was grasped Kocher clamps and dissected off the underlying rectus muscles. In a similar fashion, the inferior aspect was dissected off the rectus muscles. Rectus muscles were separated in the midline and the peritoneal cavity entered with Metzenbaum scissors. The peritoneal incision was then extended both superiorly and inferiorly with blunt dissection with careful attention to avoid both bowel and bladder. The Alexis self-retaining wound retractor was then placed within the incision and the lower uterine segment exposed. The bladder not decompressed despite previously inserted foley catheter. A new catheter was inserted and the bladder completely drained. The small bowel were protruding from within the abdominal cavity. They were carefully packed with moist lap sponges. The lower uterine segment which was very thin in nature was incised in a low transverse fashion and the cavity itself entered bluntly. The incision was extended bluntly.  Amniotic sac was ruptured and  fluid was noted to be clear in color. A Kiwi vaccuum allowed for delivery of the fetal head with 1 pull. The remainder of the infant delivered without difficulty. The infant had poor tone and cry on delivery. The nose and mouth were bulb suctioned and infant immediately handed off to the waiting pediatric team. The placenta was then spontaneously expressed from the uterus and the uterus cleared of all clots and debris with moist lap sponge. At this time, the small bowel on the right was noted to be protruding again from within the peritoneal cavity. A small bowel serosal abrasion was noted. It was intermittently bleeding. The general surgeon on-call was called for intraoperative consult.   At the middle of the hysterotomy, a 3 cm defect was noted inferior to the bottom hysterotomy edge. The posterior bladder was clearly visible and there was no concern for bladder injury. The defect was repaired using 0-vicryl suture. The hysterotomy was then repaired using 0-vicryl suture in a running locked layer. The incision was then inspected and found to be hemostatic.    Attention was then turned to the adnexa. The ovaries and tubes were freely mobile and normal in appearance. Starting at the fimbriated ends, the mesosalpinx was coagulated and cut using a Ligasure device. The tubes were cut approximately 1 cm lateral to the cornua and sent to pathology.   At this time, Dr. Hildy Lowers arrived and repaired the small bowel abrasion. See operative note for further details.   All instruments and sponges as well as the Alexis retractor were then removed from the abdomen. The fascia was then closed with 0 Vicryl in a running fashion. Subcutaneous tissue was reapproximated with 2-0 plain in a running fashion. The skin was closed with a subcuticular stitch of 4-0 Vicryl on a Keith needle and then reinforced with dermabond and a Honeycomb dressing. At the conclusion of the procedure all instruments  and sponge counts were correct. Patient was taken to the recovery room in good condition with her baby accompanying her skin to skin.   Kim Park

## 2024-04-02 NOTE — Anesthesia Postprocedure Evaluation (Signed)
 Anesthesia Post Note  Patient: Kim Park  Procedure(s) Performed: CESAREAN SECTION, WITH BILATERAL TUBAL LIGATION (Bilateral) REPAIR, SMALL INTESTINE (Abdomen)     Patient location during evaluation: Mother Baby Anesthesia Type: Spinal Level of consciousness: oriented and awake and alert Pain management: pain level controlled Vital Signs Assessment: post-procedure vital signs reviewed and stable Respiratory status: spontaneous breathing and respiratory function stable Cardiovascular status: blood pressure returned to baseline and stable Postop Assessment: no headache, no backache, no apparent nausea or vomiting and able to ambulate Anesthetic complications: no  No notable events documented.  Last Vitals:  Vitals:   04/02/24 1410 04/02/24 1715  BP: 134/70 124/76  Pulse: 94 66  Resp: 18 18  Temp:  36.6 C  SpO2: 98% 98%    Last Pain:  Vitals:   04/02/24 2146  TempSrc:   PainSc: 6                  Rosalita Combe

## 2024-04-02 NOTE — Op Note (Signed)
  04/02/2024  9:24 AM  PATIENT:  Kim Park  33 y.o. female  PRE-OPERATIVE DIAGNOSIS:  small bowel injury during cesarean section  POST-OPERATIVE DIAGNOSIS: Small bowel serosal injury during cesarean section  PROCEDURE:  Procedure(s): Repair of small bowel serosal injury  SURGEON: Dorena Gander, MD  ASSISTANTS: Jeneane Miracle, DO  ANESTHESIA:   spinal  EBL:  Total I/O In: 1100 [I.V.:1000; IV Piggyback:100] Out: -   BLOOD ADMINISTERED:none  DRAINS: none   SPECIMEN:  No Specimen  DISPOSITION OF SPECIMEN:  N/A  COUNTS:  YES  DICTATION: .Dragon Dictation Procedure detail: I was called for an intraoperative consult due to small bowel injury during cesarean section.  On my arrival, the baby had already been delivered.  I scrubbed in and evaluated the bowel.  There was a small serosal injury without full-thickness perforation in the small bowel.  There were no other small bowel injuries noted.  I repaired the serosal injury with 3-0 silk Lembert sutures.  It was completely viable.  Recommend diet as normal after cesarean section.  We will follow the patient while she was in the hospital.  Patient remains in the operating room with Dr. Felipe Horton undergoing closure. PATIENT DISPOSITION:  Remains in the operating room with Dr. Felipe Horton   Delay start of Pharmacological VTE agent (>24hrs) due to surgical blood loss or risk of bleeding:  no  Dorena Gander, MD, MPH, FACS Pager: [phone]  5/8/20259:24 AM

## 2024-04-02 NOTE — Anesthesia Procedure Notes (Addendum)
 Spinal  Patient location during procedure: OR Start time: 04/02/2024 7:33 AM End time: 04/02/2024 7:38 AM Reason for block: surgical anesthesia Staffing Performed: anesthesiologist  Anesthesiologist: Rosalita Combe, MD Performed by: Rosalita Combe, MD Authorized by: Rosalita Combe, MD   Preanesthetic Checklist Completed: patient identified, IV checked, risks and benefits discussed, surgical consent, monitors and equipment checked, pre-op evaluation and timeout performed Spinal Block Patient position: sitting Prep: DuraPrep and site prepped and draped Patient monitoring: heart rate, cardiac monitor, continuous pulse ox and blood pressure Approach: midline Location: L3-4 Injection technique: single-shot Needle Needle type: Pencan  Needle gauge: 24 G Needle length: 10 cm Needle insertion depth: 6 cm Assessment Sensory level: T4 Events: CSF return Additional Notes  2 Attempt (s). Pt tolerated procedure well.

## 2024-04-02 NOTE — Brief Op Note (Signed)
 04/02/2024  9:40 AM  PATIENT:  Stephen Ehrlich  33 y.o. female  PRE-OPERATIVE DIAGNOSIS:  repeat cesarean section and sterilization  POST-OPERATIVE DIAGNOSIS:  repeat cesarean section and sterilization 2) Serosal abrasion  PROCEDURE:  Procedure(s) with comments: CESAREAN SECTION, WITH BILATERAL TUBAL LIGATION (Bilateral) - request OB Fellow assist with repair of serosal abrasion  SURGEON:  Surgeons and Role:    * [name], DO - Primary    * Ozan, Jennifer, DO - Assisting   ASSISTANTS: Dr. Hildy Lowers, MD    ANESTHESIA:   spinal  EBL:  338  BLOOD ADMINISTERED:none  DRAINS: none   LOCAL MEDICATIONS USED:  NONE  SPECIMEN: bilateral fallopian tubes DISPOSITION OF SPECIMEN:  PATHOLOGY  COUNTS:  YES  TOURNIQUET:  * No tourniquets in log *  DICTATION: .Note written in EPIC  PLAN OF CARE: Admit to inpatient   PATIENT DISPOSITION:  PACU - hemodynamically stable.   Delay start of Pharmacological VTE agent (>24hrs) due to surgical blood loss or risk of bleeding: no

## 2024-04-03 LAB — CBC
HCT: 31.5 % — ABNORMAL LOW (ref 36.0–46.0)
Hemoglobin: 10.4 g/dL — ABNORMAL LOW (ref 12.0–15.0)
MCH: 28 pg (ref 26.0–34.0)
MCHC: 33 g/dL (ref 30.0–36.0)
MCV: 84.9 fL (ref 80.0–100.0)
Platelets: 157 10*3/uL (ref 150–400)
RBC: 3.71 MIL/uL — ABNORMAL LOW (ref 3.87–5.11)
RDW: 13.8 % (ref 11.5–15.5)
WBC: 12.5 10*3/uL — ABNORMAL HIGH (ref 4.0–10.5)
nRBC: 0 % (ref 0.0–0.2)

## 2024-04-03 MED ORDER — ENOXAPARIN SODIUM 40 MG/0.4ML IJ SOSY
40.0000 mg | PREFILLED_SYRINGE | Freq: Two times a day (BID) | INTRAMUSCULAR | Status: DC
  Administered 2024-04-03 – 2024-04-04 (×3): 40 mg via SUBCUTANEOUS
  Filled 2024-04-03 (×4): qty 0.4

## 2024-04-03 NOTE — Clinical Social Work Maternal (Signed)
 CLINICAL SOCIAL WORK MATERNAL/CHILD NOTE  Patient Details  Name: Kim Park MRN: [id number] Date of Birth: 01-30-1991  Date:  04/03/2024  Clinical Social Worker Initiating Note:  [name] Date/Time: Initiated:  04/03/24/1354     Child's Name:  Orvan Blanch   Biological Parents:  Mother, Father Kim Park Nov 03, 1991 Bess Broody 12-21-1992)   Need for Interpreter:  None   Reason for Referral:  Behavioral Health Concerns   Address:  [address] [place] [address]    Phone number:  [phone] (home)     Additional phone number:   Household Members/Support Persons (HM/SP):   Household Member/Support Person 1, Household Member/Support Person 2, Household Member/Support Person 3   HM/SP Name Relationship DOB or Age  HM/SP -1 Micheal Slaughter FOB 12-21-1992  HM/SP -2 Aiden Darene Economy 08-22-2017  HM/SP -3 Daymon Evans 01-09-2020  HM/SP -4        HM/SP -5        HM/SP -6        HM/SP -7        HM/SP -8          Natural Supports (not living in the home):  Spouse/significant other, Extended Family   Professional Supports: None   Employment: Homemaker   Type of Work:     Education:      Homebound arranged:    Surveyor, quantity Resources:  Medicaid   Other Resources:      Cultural/Religious Considerations Which May Impact Care:    Strengths:  Ability to meet basic needs  , Home prepared for child  , Merchandiser, retail, Understanding of illness, Psychotropic Medications   Psychotropic Medications:  Zoloft       Pediatrician:     Water engineer [employer], [place])  Pediatrician List:   Engineer, [place]    [place]    [facility]      Pediatrician Fax Number:    Risk Factors/Current Problems:  Mental Health Concerns     Cognitive State:  Alert  , Able to Concentrate  , Insightful  , Linear Thinking  , Goal Oriented     Mood/Affect:  Calm  ,  Comfortable  , Interested  , Relaxed     CSW Assessment: CSW received a consult for a hx of Bipolar and PTSD. CSW met MOB at bedside to complete a full psychosocial assessment and offer support. CSW entered the room, introduced herself and acknowledged that FOB was in the bathroom. MOB gave CSW verbal permission to speak about anything while he was in the bathroom. CSW explained her role and the reason for the visit. MOB presented attempting to pump while sitting on the couch and the infant was away completing his circumcision. MOB was polite, easy to engage, receptive to meeting with CSW, and appeared forthcoming.   CSW collected MOB's demographic information and inquired about her mental health history. 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 MOB reported participating in therapy at the [place] in [place]; however they had to discontinue her sessions during her pregnancy due to her pregnancy. MOB reported now that she has delivered she will restart her therapy sessions with her first appointment being in 2 weeks. MOB reported  being currently prescribed Sertraline  100mg  and will continue this medication regiment during the PP period. 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 CSW provided education regarding the baby blues period vs. perinatal mood disorders, discussed treatment and gave resources for mental health follow up if concerns arise.  CSW recommends self-evaluation during the postpartum time period using the New Mom Checklist from Postpartum Progress and encouraged MOB to contact a medical professional if symptoms are noted at any time.  CSW assessed for safety with MOB SI/HI; MOB denied SI/HI. CSW did not assess  for DV; FOB was present.  CSW asked MOB does she receive support resources; MOB said No(WIC and food stamps).  MOB reported having all essential items for the infant including a carseat, bassinet and crib for safe sleeping. CSW provided review of Sudden Infant Death Syndrome (SIDS) precautions.  CSW Plan/Description:  CSW identifies no further need for intervention and no barriers to discharge at this time.    Veva Gower, LCSW 04/03/2024, 1:59 PM

## 2024-04-03 NOTE — Progress Notes (Signed)
    1 Day Post-Op  Subjective: Some soreness on the right side of her abdomen, but otherwise pain well controlled.  Walking around her room.  Tolerating a solid diet and has actually already had a BM.  Objective: Vital signs in last 24 hours: Temp:  [97.8 F (36.6 C)-99.2 F (37.3 C)] 98.7 F (37.1 C) (05/09 0457) Pulse Rate:  [64-94] 80 (05/09 0457) Resp:  [15-18] 16 (05/09 0457) BP: (107-134)/(67-77) 107/75 (05/09 0457) SpO2:  [96 %-100 %] 97 % (05/09 0457)    Intake/Output from previous day: 05/08 0701 - 05/09 0700 In: 2000 [I.V.:1900; IV Piggyback:100] Out: 1483 [Urine:1100; Blood:383] Intake/Output this shift: No intake/output data recorded.  PE: Abd: soft, still distended, incision is clean and covered with a honeycomb dressing.  Lab Results:  Recent Labs    04/03/24 0605  WBC 12.5*  HGB 10.4*  HCT 31.5*  PLT 157   BMET Recent Labs    04/02/24 1136  CREATININE 0.53   PT/INR No results for input(s): "LABPROT", "INR" in the last 72 hours. CMP     Component Value Date/Time   NA 135 03/26/2024 1456   K 3.9 03/26/2024 1456   CL 107 03/26/2024 1456   CO2 19 (L) 03/26/2024 1456   GLUCOSE 81 03/26/2024 1456   BUN 7 03/26/2024 1456   CREATININE 0.53 04/02/2024 1136   CALCIUM 8.6 (L) 03/26/2024 1456   PROT 5.8 (L) 03/26/2024 1456   ALBUMIN 2.2 (L) 03/26/2024 1456   AST 17 03/26/2024 1456   ALT 12 03/26/2024 1456   ALKPHOS 164 (H) 03/26/2024 1456   BILITOT 0.4 03/26/2024 1456   GFRNONAA >60 04/02/2024 1136   Lipase  No results found for: "LIPASE"     Studies/Results: No results found.  Anti-infectives: Anti-infectives (From admission, onward)    Start     Dose/Rate Route Frequency Ordered Stop   04/02/24 0615  ceFAZolin  (ANCEF ) IVPB 2g/100 mL premix        2 g 200 mL/hr over 30 Minutes Intravenous On call to O.R. 04/02/24 0602 04/02/24 0735        Assessment/Plan POD 1, s/p c-section with oversew of serosal injury, Dr. Hildy Lowers, Dr.  Felipe Horton, 5/8 -doing well with no concerning complaints -tolerating a regular diet and already moving her bowels -no further acute general surgery needs.  We will be available if needed.  FEN - regular VTE - Lovenox  ID - none    LOS: 1 day    Leone Ralphs , [facility] 04/03/2024, 10:37 AM Please see Amion for pager number during day hours 7:00am-4:30pm or 7:00am -11:30am on weekends

## 2024-04-03 NOTE — Progress Notes (Signed)
 Patient ID: Kim Park, female   DOB: November 26, 1991, 33 y.o.   MRN: [id number]  Patient seen this afternoon. Discussed surgery and answered questions. She is working on sustained latch with son and older children are coming to meet newborn soon. She had a BM this morning. Pain is controlled with ERAS protocol-she is trying to avoid opiates, pain L>R.

## 2024-04-03 NOTE — Progress Notes (Signed)
 POSTPARTUM POSTOP PROGRESS NOTE  POD #1  Subjective:  No acute events overnight.  Pt denies problems with ambulating, voiding or po intake.  She denies nausea or vomiting.  Pain is well controlled.  She has not had flatus. She has not had bowel movement.  Lochia Minimal. Reviewed intra-op events. Baby boy planned for circ this afternoon - deferred this AM 2/2 bath  Objective: Blood pressure 107/75, pulse 80, temperature 98.7 F (37.1 C), temperature source Oral, resp. rate 16, SpO2 97%, unknown if currently breastfeeding.  Physical Exam:  General: alert, cooperative and no distress Lochia:normal flow Chest: CTAB Heart: RRR no m/r/g Abdomen: +BS, soft, nontender Uterine Fundus: firm, 2cm bwlo umbilicus. Honeycomb dressing intact, minimal drainage Extremities: trace pedal edema, neg calf TTP BL, neg Homans BL  Recent Labs    04/03/24 0605  HGB 10.4*  HCT 31.5*    Assessment/Plan:  ASSESSMENT: LENNEX SOTAK is a 33 y.o. [id number] s/p ERLTCS/BS with intra-op consult for small bowel serosal repair by Gen Surg @ [redacted]w[redacted]d for h/o csx, satsified fertility. PNC c/b obesity, PTSD on Rx, hypoT on meds.   Breastfeeding, Lactation consult, Circumcision prior to discharge, and Contraception s/p BS Plan DC home POD#2-3 Circ later this afternoon Gen Surg following for intra-op SB consult, appreciate. Routine postop otherwise    *Obesity - postop lovenox  and SCDs while in house Continue home sertraline  and levothyroxine     LOS: 1 day

## 2024-04-04 NOTE — Progress Notes (Signed)
 Patient complained of a lump on her right side under her skin fold , She states it was not there prior . I called Dr. Arlyne Lame and notifed her of the lump, she stated to draw around it and keep an eye on it.

## 2024-04-04 NOTE — Progress Notes (Addendum)
 Subjective: Postpartum Day 2: Cesarean Delivery Patient reports incisional pain, tolerating PO, + flatus, and no problems voiding.  Pain controlled with meds. She denies fever, chills, SOB, CP or lightheadedness. She is worried about son being tongue tied ( see lactation note). She would prefer frenelum clipped while in hospital if possible. Pumping - worried about supply but does have some saved from prior to delivery  Objective: Vital signs in last 24 hours: Temp:  [97.6 F (36.4 C)-98.5 F (36.9 C)] 98.4 F (36.9 C) (05/10 0520) Pulse Rate:  [84-98] 84 (05/10 0520) Resp:  [18] 18 (05/09 1502) BP: (116-123)/(66-79) 123/79 (05/10 0520) SpO2:  [98 %-99 %] 99 % (05/10 0520)  Physical Exam:  General: alert, cooperative, and no distress Lochia: appropriate Uterine Fundus: firm Incision: no significant drainage DVT Evaluation: No evidence of DVT seen on physical exam.  Recent Labs    04/03/24 0605  HGB 10.4*  HCT 31.5*    Assessment/Plan: Status post Cesarean section. Doing well postoperatively. Will enquire from peds re possibility of procedure for baby  Continue current care Will stay till tomorrow to work more on feeding.  Circumcision done yesterday   Levis Reams, DO 04/04/2024, 7:26 AM

## 2024-04-04 NOTE — Progress Notes (Signed)
 CSW acknowledges TOC consult placed due to Edinburgh score of 13. Clinical social worker met with MOB 09/17/24 and completed clinical assessment. Mental health concerns were addressed during consult. No further need for intervention or barriers to discharge at this time.   Please contact CSW if additional needs arise or by MOB request.   Signed,  Elizabeth Gulling, MSW, LCSWA, LCASA Dec 12, 2023 9:38 AM

## 2024-04-04 NOTE — Lactation Note (Signed)
 This note was copied from a baby's chart. Lactation Consultation Note  Patient Name: Boy Victorious Weakley [id number] Date: 04/04/2024 Age:31 hours  Reason for consult: Maternal endocrine disorder (NP request)  P3, [redacted]w[redacted]d, 5% weight loss  Follow up LC visit. Mother was in good spirits and receptive to [facility] visit. Baby had recently formula fed and sleeping on mother's chest. Mother states that she is still working on Art therapist. She says her right breast is "cup size larger than the left" and she has not been successful with positioning and latching baby on that breast. She is requesting assistance with latching baby onto that breast at his next feeding. Mother was advised that a new LC would be replacing me soon. Mother has met the oncoming LC last night and would like for her to assist her this evening.  Mother states that the Spectra  pump is new to her and she is trying to figure it out. Advised to go the website for videos and information. Mother agreeable. Offered that we can set up the hospital DEBP if she would like, just let staff know.  Mother has been concerned about not producing larger quantities of milk yet. She says that her husband reminded her that her milk volume increased and breast were full when she was leaving the hospital after delivery (2-3 days postpartum). She reports she feels better remembering that and she plans to keep pumping and supplement baby's feedings. She plans to feed donor breast milk at the next feeding. Mother is giving baby any colostrum that she pumps.   Feeding Nipple Type: Slow - flow  LATCH Score  Not observed     Lactation Tools Discussed/Used  Mother prefers to with her her own breast pump over the hospital pump  Consult Status Consult Status: Follow-up Date: 04/05/24 Follow-up type: In-patient    Gearline Kell M 04/04/2024, 6:28 PM

## 2024-04-04 NOTE — Lactation Note (Signed)
 This note was copied from a baby's chart. Lactation Consultation Note  Patient Name: Kim Park ZOXWR'U Date: 04/04/2024 Age:31 hours Reason for consult: Initial assessment;Early term 37-38.6wks;Maternal endocrine disorder Mom originally didn't want to see Lactation d/t bad experienced w/Lactation w/her last child. Mom concerned over baby not feeding agreed w/RN that she should see Lactation. Mom concerned about baby not eating well. Mom states baby will suckle well a few times at the breast then let it go and cry.  Mom has short shaft nipples but not to short and is compressible the baby should be able to latch. LC placed baby at the breast in cradle and football position. Baby crying and will not latch. Mom had BM in bottle. LC gave baby that 15 ml colostrum. Baby has very uncoordinated suck. Moving his head, searching, acting like he doesn't know what to do w/the nipple in his mouth. LC angled it different ways to see if he preferred one thing over another.  LC felt it was different baby kept looking over to the corners and upwards. LC moved baby's head and rubbing cheeks to get baby to stop looking that way then he went to the other side. LC rubbed baby to get to center eyes back. LC was concerned how far over eyes went, couldn't see that much of the iris of his eyes. He acted like he couldn't see? It was different. LC reported to RN.  When baby was sleeping LC attempting to wake baby to stimulate him to eat more BM. LC sat baby upright and patted baby. Then placed nipple in his mouth and squirted a like BM on his tongue to taste. Baby started suckling well. Normal. But when he is awake he acts like he doesn't know how to do it. He chews and chomps verses suckling. Baby needs ST evaluation please. Baby has tight posterior frenulum d/t limited mobility of upwards mobility of tongue.  Mom stated 1st child has tongue tie and speech issues. He is almost 31 yrs old. She didn't have it fixed.  Now he is home school from being bullied in school because of speech issues. Mom doesn't want that for this baby. Gave mom information paper of tongue tie dentist. Gave mom feeding information of how much to feed baby at hours of age. Encouraged mom to call for assistance as needed. Mom a little nervous d/t pumping and collected nothing. Mom has been stressed over baby not eating. Encouraged mom to relax and try not to be stressed. Encouraged to call for assistance as needed.    Maternal Data Does the patient have breastfeeding experience prior to this delivery?: Yes How long did the patient breastfeed?: pumped 18 months 1st child, BF 10months 2nd child  Feeding    LATCH Score Latch: Too sleepy or reluctant, no latch achieved, no sucking elicited.  Audible Swallowing: None  Type of Nipple: Everted at rest and after stimulation  Comfort (Breast/Nipple): Soft / non-tender  Hold (Positioning): Full assist, staff holds infant at breast  LATCH Score: 4   Lactation Tools Discussed/Used    Interventions Interventions: Breast feeding basics reviewed;Assisted with latch;Skin to skin;Breast massage;Hand express;Breast compression;Adjust position;Support pillows;Position options;Expressed milk;DEBP;Education;Pace feeding;LC Services brochure  Discharge    Consult Status Consult Status: Follow-up Date: 04/04/24 Follow-up type: In-patient    Machelle Raybon G 04/04/2024, 5:41 AM

## 2024-04-05 MED ORDER — IBUPROFEN 600 MG PO TABS: 600.0000 mg | ORAL_TABLET | Freq: Four times a day (QID) | ORAL | 1 refills | Status: AC | PRN

## 2024-04-05 MED ORDER — OXYCODONE-ACETAMINOPHEN 5-325 MG PO TABS: 1.0000 | ORAL_TABLET | ORAL | 0 refills | Status: AC | PRN

## 2024-04-05 NOTE — Lactation Note (Signed)
 This note was copied from a baby's chart. Lactation Consultation Note  Patient Name: Kim Park ZOXWR'U Date: 04/05/2024 Age:31 hours Reason for consult: Follow-up assessment;Term;Infant weight loss;Maternal endocrine disorder Per mom the baby last fed at 7 am with some spitting after feeding( see doc flow sheets . LC mentioned to mom with feeding at the breast 1st for 15 mins and then having 45 ml EBM and 45 ml of formula feeding.  Is a huge meal for a 72 hour old baby. LC recommended bring down the volume of formula.  Per mom plan is to breast feed, bottle feed and pump.  LC reviewed BF D/C teaching and the importance of engorgement prevention and tx., S/S of mastitis.  Per mom I always have done  extra pumping with all her babies and experienced mastitis x1 .    Maternal Data Does the patient have breastfeeding experience prior to this delivery?: Yes  Feeding Mother's Current Feeding Choice: Breast Milk and Formula Nipple Type: Dr. Michelene Ahmadi Preemie  LATCH Score- baby recently fed at 7 am at the breast and supplemented 90 ml ( see doc flow sheets, baby sound  asleep and LC unable to assess latch     Lactation Tools Discussed/Used Tools: Pump;Flanges Flange Size: 24;27 Breast pump type: Manual Pump Education: Milk Storage;Setup, frequency, and cleaning Reason for Pumping: per mom pumps and breast feeds  Interventions Interventions: Breast feeding basics reviewed;Hand pump;Education;LC Services brochure;CDC milk storage guidelines;CDC Guidelines for Breast Pump Cleaning  Discharge Discharge Education: Engorgement and breast care;Warning signs for feeding baby Pump: Personal;Hands Free;DEBP;Manual  Consult Status Consult Status: Complete Date: 04/05/24    Kim Park 04/05/2024, 9:00 AM

## 2024-04-05 NOTE — Progress Notes (Addendum)
 Subjective: Postpartum Day 3: Cesarean Delivery Patient reports tolerating PO, + flatus, + BM, and no problems voiding.  She describes a tender firm area to the right of her incision that is noticeable when she is upright ( less so when laying down). No n/v, abnormal bleeding, CP, SOB or fever.  Her milk production increased last night and son latching better. She feels good about discharge to home today   Objective: Vital signs in last 24 hours: Temp:  [97.6 F (36.4 C)-98.6 F (37 C)] 97.6 F (36.4 C) (05/11 0554) Pulse Rate:  [83-89] 83 (05/11 0554) Resp:  [18] 18 (05/11 0554) BP: (119-123)/(74) 123/74 (05/11 0554)  Physical Exam:  General: alert, cooperative, and no distress Lochia: appropriate Uterine Fundus: firm Incision: no significant drainage Moderately firmm, non well circumscribed nodule measuring about 2cm to the right of her incision noted. Not present when laying down but when upright likely exacerbated by gravity being at bottom of her pannus. No skin changes. Mildly tender.  Similar but smaller site noted on pt left - pointed out to patient  DVT Evaluation: No evidence of DVT seen on physical exam. Negative Homan's sign.  Recent Labs    04/03/24 0605  HGB 10.4*  HCT 31.5*    Assessment/Plan: Status post Cesarean section. Doing well postoperatively.  Discharge home with standard precautions and return to clinic in 1 week for incision check, follow up from small bowel injury and to check on pp depression and 6 weeks for postpartum visit   Manal Kreutzer W Maize Brittingham, DO 04/05/2024, 7:25 AM

## 2024-04-05 NOTE — Progress Notes (Signed)
 Patient ID: Kim Park, female   DOB: [birth year], 33 y.o.   MRN: [id number] Pt c/o tender "bump to the right of her incision . Cannot recall if present prior to today. Advised nurse to mark site to monitor to see if increases in size. Pushing with another patient so will see if pt awake when I am done then I can assess it.

## 2024-04-05 NOTE — Discharge Instructions (Signed)
 Call office with any concerns [phone]

## 2024-04-05 NOTE — Plan of Care (Signed)
Progressing well.

## 2024-04-05 NOTE — Discharge Summary (Signed)
 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 yo G3P3003 at [redacted]w[redacted]d was admitted to the hospital 04/02/2024 for scheduled cesarean section with the following indication:Prior Uterine Surgery.Delivery details are as follows:  Membrane Rupture Time/Date: 8:16 AM,04/02/2024  Delivery Method:C-Section, Vacuum Assisted Operative Delivery:N/A Details of operation can be found in separate operative note.  Patient had a postpartum course complicated by small bowel injury during cesarean section .  She is ambulating, tolerating a regular diet, passing flatus, and urinating well. Patient is discharged home in stable condition on  04/05/24        Newborn Data: Birth date:04/02/2024 Birth time:8:18 AM Gender:Female Living status:Living Apgars:6 ,7  Weight:3090 g    Magnesium  Sulfate received: No BMZ received: No Rhophylac:N/A MMR:N/A T-DaP:Given prenatally Flu: N/A RSV Vaccine received: No Transfusion:No Immunizations administered: Immunization History  Administered Date(s) Administered   PFIZER(Purple Top)SARS-COV-2 Vaccination 07/04/2020, 07/25/2020    Physical exam  Vitals:   04/03/24 2001 04/04/24 0520 04/04/24 2200  04/05/24 0554  BP: 123/72 123/79 119/74 123/74  Pulse: 95 84 89 83  Resp:   18 18  Temp: 98.5 F (36.9 C) 98.4 F (36.9 C) 98.6 F (37 C) 97.6 F (36.4 C)  TempSrc: Oral Oral Oral Oral  SpO2: 98% 99%     General: alert, cooperative, and no distress Lochia: appropriate Uterine Fundus: firm Incision: Healing well with no significant drainage, Dressing is clean, dry, and intact, tender area to right of incision noted - likely inflammatory from suture DVT Evaluation: No evidence of DVT seen on physical exam. Labs: Lab Results  Component Value Date   WBC 12.5 (H) 04/03/2024   HGB 10.4 (L) 04/03/2024   HCT 31.5 (L) 04/03/2024   MCV 84.9 04/03/2024   PLT 157 04/03/2024      Latest Ref Rng & Units 04/02/2024   11:36 AM  CMP  Creatinine 0.44 - 1.00 mg/dL 6.57    Edinburgh Score:    04/04/2024    7:30 AM  Edinburgh Postnatal Depression Scale Screening Tool  I have been able to laugh and see the funny side of things. 0  I have looked forward with enjoyment to things. 1  I have blamed myself unnecessarily when things went wrong. 3  I have been anxious or worried for no good reason. 2  I have felt scared or panicky for no good reason. 1  Things have been getting on top of me. 1  I have been so unhappy that I have had difficulty sleeping. 2  I have felt sad or miserable. 2  I have been so  unhappy that I have been crying. 1  The thought of harming myself has occurred to me. 0  Edinburgh Postnatal Depression Scale Total 13      After visit meds:  Allergies as of 04/05/2024       Reactions   Prednisone Swelling   Mouth and face        Medication List     TAKE these medications    ibuprofen  600 MG tablet Commonly known as: ADVIL  Take 1 tablet (600 mg total) by mouth every 6 (six) hours as needed for moderate pain (pain score 4-6) or cramping.   levothyroxine  50 MCG tablet Commonly known as: SYNTHROID  Take 50 mcg by mouth daily before breakfast.   omeprazole 20 MG  capsule Commonly known as: PRILOSEC Take 20 mg by mouth daily.   ondansetron  4 MG tablet Commonly known as: Zofran  Take 1 tablet (4 mg total) by mouth every 8 (eight) hours as needed for nausea or vomiting.   oxyCODONE -acetaminophen  5-325 MG tablet Commonly known as: Percocet Take 1 tablet by mouth every 4 (four) hours as needed for up to 7 days for severe pain (pain score 7-10).   prenatal multivitamin Tabs tablet Take 1 tablet by mouth daily at 12 noon.   sertraline  100 MG tablet Commonly known as: ZOLOFT  Take 100 mg by mouth daily.         Discharge home in stable condition Infant Feeding: Breast Infant Disposition:home with mother Discharge instruction: per After Visit Summary and Postpartum booklet. Activity: Advance as tolerated. Pelvic rest for 6 weeks.  Diet: routine diet Anticipated Birth Control: BTL done PP Postpartum Appointment:6 weeks Additional Postpartum F/U: Postpartum Depression checkup, Incision check 1 week, and follow up from small bowel injury  Future Appointments:No future appointments. Follow up Visit:  Follow-up Information     [facility]. Schedule an appointment as soon as possible for a visit.   Why: 2 week for incision check and 6 weeks for postpartum visit Contact information: [address]  [address] [place] [address] [phone]                     04/05/2024 Levis Reams, DO

## 2024-04-06 LAB — SURGICAL PATHOLOGY

## 2024-04-21 ENCOUNTER — Telehealth (HOSPITAL_COMMUNITY): Payer: Self-pay | Admitting: *Deleted

## 2024-04-21 NOTE — Telephone Encounter (Signed)
 04/21/2024  Name: Kim Park MRN: [id number] DOB: June 25, 1991  Reason for Call:  Transition of Care Hospital Discharge Call  Contact Status: Patient Contact Status: Complete  Language assistant needed: Interpreter Mode: Interpreter Not Needed        Follow-Up Questions: Do You Have Any Concerns About Your Health As You Heal From Delivery?: No Do You Have Any Concerns About Your Infants Health?: No  Edinburgh Postnatal Depression Scale:  In the Past 7 Days:    PHQ2-9 Depression Scale:     Discharge Follow-up: Edinburgh score requires follow up?: N/A (Patient declines completing EPDS now, stating she is in the car with her children. Patient declines a call back at a better time, but states that she is doing well and already has a mental health appointment scheduled for tomorrow, 04/22/2024.) Patient was advised of the following resources:: Support Group, Breastfeeding Support Group  Post-discharge interventions: Reviewed Newborn Safe Sleep Practices  [name]  04/21/2024 1126
